# Patient Record
Sex: Male | Born: 1950 | Race: White | Hispanic: No | Marital: Married | Smoking: Current every day smoker
Health system: Southern US, Community
[De-identification: ages and names within clinical notes are randomized; demographics above are authoritative.]

## PROBLEM LIST (undated history)

## (undated) DIAGNOSIS — F319 Bipolar disorder, unspecified: Secondary | ICD-10-CM

## (undated) DIAGNOSIS — G629 Polyneuropathy, unspecified: Secondary | ICD-10-CM

---

## 2010-12-30 ENCOUNTER — Emergency Department (HOSPITAL_BASED_OUTPATIENT_CLINIC_OR_DEPARTMENT_OTHER)
Admission: EM | Admit: 2010-12-30 | Discharge: 2010-12-30 | Disposition: A | Discharge status: Home / Self Care | Payer: Self-pay | Attending: Emergency Medicine | Admitting: Emergency Medicine

## 2010-12-30 ENCOUNTER — Emergency Department (INDEPENDENT_AMBULATORY_CARE_PROVIDER_SITE_OTHER): Payer: Self-pay

## 2010-12-30 ENCOUNTER — Other Ambulatory Visit: Payer: Self-pay

## 2010-12-30 DIAGNOSIS — J189 Pneumonia, unspecified organism: Secondary | ICD-10-CM | POA: Insufficient documentation

## 2010-12-30 DIAGNOSIS — W19XXXA Unspecified fall, initial encounter: Secondary | ICD-10-CM | POA: Insufficient documentation

## 2010-12-30 DIAGNOSIS — R209 Unspecified disturbances of skin sensation: Secondary | ICD-10-CM | POA: Insufficient documentation

## 2010-12-30 DIAGNOSIS — F319 Bipolar disorder, unspecified: Secondary | ICD-10-CM | POA: Insufficient documentation

## 2010-12-30 DIAGNOSIS — F17200 Nicotine dependence, unspecified, uncomplicated: Secondary | ICD-10-CM | POA: Insufficient documentation

## 2010-12-30 DIAGNOSIS — R05 Cough: Secondary | ICD-10-CM

## 2010-12-30 HISTORY — DX: Bipolar disorder, unspecified: F31.9

## 2010-12-30 HISTORY — DX: Polyneuropathy, unspecified: G62.9

## 2010-12-30 LAB — COMPREHENSIVE METABOLIC PANEL
ALT: 16 U/L (ref 0–53)
AST: 23 U/L (ref 0–37)
Calcium: 9.2 mg/dL (ref 8.4–10.5)
GFR calc Af Amer: >60 mL/min (ref >60)
Glucose, Bld: 108 mg/dL — ABNORMAL HIGH (ref 70–99)
Sodium: 136 mEq/L (ref 135–145)
Total Protein: 7.1 g/dL (ref 6.0–8.3)

## 2010-12-30 LAB — URINALYSIS, ROUTINE W REFLEX MICROSCOPIC
Bilirubin Urine: NEGATIVE
Nitrite: NEGATIVE
Specific Gravity, Urine: 1.016 (ref 1.005–1.030)
pH: 6 (ref 5.0–8.0)

## 2010-12-30 LAB — CBC
MCH: 30.7 pg (ref 26.0–34.0)
MCHC: 33 g/dL (ref 30.0–36.0)
Platelets: 267 10*3/uL (ref 150–400)
RDW: 13.1 % (ref 11.5–15.5)

## 2010-12-30 LAB — DIFFERENTIAL
Basophils Absolute: 0 10*3/uL (ref 0.0–0.1)
Eosinophils Absolute: 0 10*3/uL (ref 0.0–0.7)
Monocytes Absolute: 1.6 10*3/uL — ABNORMAL HIGH (ref 0.1–1.0)
Neutrophils Relative %: 82 % — ABNORMAL HIGH (ref 43–77)

## 2010-12-30 MED ORDER — DEXTROSE 5 % IV SOLN
500.0000 mg | Freq: Once | INTRAVENOUS | Status: DC
  Filled 2010-12-30: qty 500

## 2010-12-30 MED ORDER — AZITHROMYCIN 250 MG PO TABS: 250.0000 mg | ORAL_TABLET | Freq: Every day | ORAL | Status: AC

## 2010-12-30 MED ORDER — SODIUM CHLORIDE 0.9 % IV SOLN
Freq: Once | INTRAVENOUS | Status: AC
  Administered 2010-12-30 (×2): via INTRAVENOUS

## 2010-12-30 MED ORDER — SODIUM CHLORIDE 0.9 % IV SOLN
Freq: Once | INTRAVENOUS | Status: AC
  Administered 2010-12-30: 15:00:00 via INTRAVENOUS

## 2010-12-30 MED ORDER — DEXTROSE 5 % IV SOLN
1.0000 g | INTRAVENOUS | Status: DC
  Administered 2010-12-30: 1 g via INTRAVENOUS
  Filled 2010-12-30: qty 1

## 2010-12-30 NOTE — ED Provider Notes (Signed)
History     CSN: [id number] Arrival date & time: 12/30/2010 12:27 PM  Chief Complaint  Patient presents with  . Fall   Patient is a 60 y.o. male presenting with fall. The history is provided by the patient. No language interpreter was used.  Fall The accident occurred 6 to 12 hours ago. The fall occurred while recreating/playing. He fell from a height of 3 to 5 ft. He landed on carpet. The pain is at a severity of 8/10. The pain is moderate. He was ambulatory at the scene. There was no entrapment after the fall. There was no drug use involved in the accident. There was no alcohol use involved in the accident. Associated symptoms include numbness. Pertinent negatives include no nausea. The symptoms are aggravated by activity. He has tried nothing for the symptoms. The treatment provided moderate relief.  Pt complains  Of feeling weak today. Pt reports he fell down earlier.  Pt reports he has had o cough and has not been feeling well.  Past Medical History  Diagnosis Date  . Bipolar illness   . Neuropathy     History reviewed. No pertinent past surgical history.  No family history on file.  History  Substance Use Topics  . Smoking status: Current Everyday Smoker  . Smokeless tobacco: Not on file  . Alcohol Use:       Review of Systems  Gastrointestinal: Negative for nausea.  Neurological: Positive for numbness.  All other systems reviewed and are negative.    Physical Exam  BP 94/50  Pulse 87  Temp(Src) 99.3 F (37.4 C) (Oral)  Resp 24  Ht 5\' 10"  (1.778 m)  Wt 160 lb (72.576 kg)  BMI 22.96 kg/m2  SpO2 98%  Physical Exam  Nursing note and vitals reviewed. Constitutional: He is oriented to person, place, and time. He appears well-developed and well-nourished.  HENT:  Head: Normocephalic.  Eyes: Pupils are equal, round, and reactive to light.  Neck: Normal range of motion.  Cardiovascular: Normal rate.   Pulmonary/Chest: Effort normal.  Abdominal: Soft.    Musculoskeletal: Normal range of motion.  Neurological: He is alert and oriented to person, place, and time. He has normal reflexes.  Skin: Skin is warm and dry.  Psychiatric: He has a normal mood and affect.    ED Course  Procedures  MDM Chest xray shows pneumonia.  Pt reports he feels better after Iv fluids,  Pt given ROcephin and zithromax.   Pt advised to see his MD for recheck tommorow.  Pt given RX for zithromax.      Langston Masker, [place] 12/30/10 [phone]

## 2010-12-30 NOTE — ED Notes (Signed)
IVF infused and site saline locked.  Pt ambulatory to BR with assistance.

## 2010-12-30 NOTE — ED Notes (Signed)
Family at bedside.  Eating,tolerating well,no complaints at present

## 2010-12-30 NOTE — ED Notes (Signed)
Pt ambulatory in hallway by EMT.  Pt had no difficulty ambulating.

## 2010-12-30 NOTE — ED Notes (Signed)
Pt fell twice today-pt deinies LOC-states he became dizzy and fell-uses cane

## 2011-01-01 NOTE — ED Provider Notes (Signed)
Medical screening examination/treatment/procedure(s) were performed by non-physician practitioner and as supervising physician I was immediately available for consultation/collaboration.   Alessia Gonsalez B. Bernette Mayers, MD 01/01/11 1017

## 2015-01-04 DIAGNOSIS — K219 Gastro-esophageal reflux disease without esophagitis: Secondary | ICD-10-CM | POA: Diagnosis not present

## 2015-01-04 DIAGNOSIS — M545 Low back pain: Secondary | ICD-10-CM | POA: Diagnosis not present

## 2015-01-04 DIAGNOSIS — R252 Cramp and spasm: Secondary | ICD-10-CM | POA: Diagnosis not present

## 2015-01-04 DIAGNOSIS — F900 Attention-deficit hyperactivity disorder, predominantly inattentive type: Secondary | ICD-10-CM | POA: Diagnosis not present

## 2015-01-04 DIAGNOSIS — Z79899 Other long term (current) drug therapy: Secondary | ICD-10-CM | POA: Diagnosis not present

## 2015-01-31 DIAGNOSIS — F988 Other specified behavioral and emotional disorders with onset usually occurring in childhood and adolescence: Secondary | ICD-10-CM | POA: Diagnosis not present

## 2015-01-31 DIAGNOSIS — Z6823 Body mass index (BMI) 23.0-23.9, adult: Secondary | ICD-10-CM | POA: Diagnosis not present

## 2015-01-31 DIAGNOSIS — E782 Mixed hyperlipidemia: Secondary | ICD-10-CM | POA: Diagnosis not present

## 2015-01-31 DIAGNOSIS — F5104 Psychophysiologic insomnia: Secondary | ICD-10-CM | POA: Diagnosis not present

## 2015-01-31 DIAGNOSIS — M545 Low back pain: Secondary | ICD-10-CM | POA: Diagnosis not present

## 2015-03-01 DIAGNOSIS — M545 Low back pain: Secondary | ICD-10-CM | POA: Diagnosis not present

## 2015-03-01 DIAGNOSIS — F988 Other specified behavioral and emotional disorders with onset usually occurring in childhood and adolescence: Secondary | ICD-10-CM | POA: Diagnosis not present

## 2015-03-01 DIAGNOSIS — R0981 Nasal congestion: Secondary | ICD-10-CM | POA: Diagnosis not present

## 2015-03-01 DIAGNOSIS — G2581 Restless legs syndrome: Secondary | ICD-10-CM | POA: Diagnosis not present

## 2015-03-01 DIAGNOSIS — Z79899 Other long term (current) drug therapy: Secondary | ICD-10-CM | POA: Diagnosis not present

## 2015-03-29 DIAGNOSIS — G2581 Restless legs syndrome: Secondary | ICD-10-CM | POA: Diagnosis not present

## 2015-03-29 DIAGNOSIS — M545 Low back pain: Secondary | ICD-10-CM | POA: Diagnosis not present

## 2015-03-29 DIAGNOSIS — Z79899 Other long term (current) drug therapy: Secondary | ICD-10-CM | POA: Diagnosis not present

## 2015-03-29 DIAGNOSIS — Z6823 Body mass index (BMI) 23.0-23.9, adult: Secondary | ICD-10-CM | POA: Diagnosis not present

## 2015-03-29 DIAGNOSIS — F988 Other specified behavioral and emotional disorders with onset usually occurring in childhood and adolescence: Secondary | ICD-10-CM | POA: Diagnosis not present

## 2015-04-25 DIAGNOSIS — M545 Low back pain: Secondary | ICD-10-CM | POA: Diagnosis not present

## 2015-04-25 DIAGNOSIS — Z6823 Body mass index (BMI) 23.0-23.9, adult: Secondary | ICD-10-CM | POA: Diagnosis not present

## 2015-04-25 DIAGNOSIS — E782 Mixed hyperlipidemia: Secondary | ICD-10-CM | POA: Diagnosis not present

## 2015-05-04 DIAGNOSIS — L821 Other seborrheic keratosis: Secondary | ICD-10-CM | POA: Diagnosis not present

## 2015-05-04 DIAGNOSIS — D492 Neoplasm of unspecified behavior of bone, soft tissue, and skin: Secondary | ICD-10-CM | POA: Diagnosis not present

## 2015-05-04 DIAGNOSIS — C44212 Basal cell carcinoma of skin of right ear and external auricular canal: Secondary | ICD-10-CM | POA: Diagnosis not present

## 2015-05-04 DIAGNOSIS — L570 Actinic keratosis: Secondary | ICD-10-CM | POA: Diagnosis not present

## 2015-05-04 DIAGNOSIS — L814 Other melanin hyperpigmentation: Secondary | ICD-10-CM | POA: Diagnosis not present

## 2015-05-24 DIAGNOSIS — Z79899 Other long term (current) drug therapy: Secondary | ICD-10-CM | POA: Diagnosis not present

## 2015-05-24 DIAGNOSIS — Z1389 Encounter for screening for other disorder: Secondary | ICD-10-CM | POA: Diagnosis not present

## 2015-05-24 DIAGNOSIS — M545 Low back pain: Secondary | ICD-10-CM | POA: Diagnosis not present

## 2015-05-24 DIAGNOSIS — E559 Vitamin D deficiency, unspecified: Secondary | ICD-10-CM | POA: Diagnosis not present

## 2015-05-24 DIAGNOSIS — J069 Acute upper respiratory infection, unspecified: Secondary | ICD-10-CM | POA: Diagnosis not present

## 2015-05-24 DIAGNOSIS — E782 Mixed hyperlipidemia: Secondary | ICD-10-CM | POA: Diagnosis not present

## 2015-06-21 DIAGNOSIS — M545 Low back pain: Secondary | ICD-10-CM | POA: Diagnosis not present

## 2015-06-21 DIAGNOSIS — E559 Vitamin D deficiency, unspecified: Secondary | ICD-10-CM | POA: Diagnosis not present

## 2015-06-21 DIAGNOSIS — R21 Rash and other nonspecific skin eruption: Secondary | ICD-10-CM | POA: Diagnosis not present

## 2015-06-21 DIAGNOSIS — E782 Mixed hyperlipidemia: Secondary | ICD-10-CM | POA: Diagnosis not present

## 2015-06-21 DIAGNOSIS — F988 Other specified behavioral and emotional disorders with onset usually occurring in childhood and adolescence: Secondary | ICD-10-CM | POA: Diagnosis not present

## 2015-07-12 DIAGNOSIS — Z6823 Body mass index (BMI) 23.0-23.9, adult: Secondary | ICD-10-CM | POA: Diagnosis not present

## 2015-07-12 DIAGNOSIS — M545 Low back pain: Secondary | ICD-10-CM | POA: Diagnosis not present

## 2015-07-12 DIAGNOSIS — G629 Polyneuropathy, unspecified: Secondary | ICD-10-CM | POA: Diagnosis not present

## 2015-07-23 DIAGNOSIS — C4441 Basal cell carcinoma of skin of scalp and neck: Secondary | ICD-10-CM | POA: Diagnosis not present

## 2015-08-08 DIAGNOSIS — Z6824 Body mass index (BMI) 24.0-24.9, adult: Secondary | ICD-10-CM | POA: Diagnosis not present

## 2015-08-08 DIAGNOSIS — Z79899 Other long term (current) drug therapy: Secondary | ICD-10-CM | POA: Diagnosis not present

## 2015-08-08 DIAGNOSIS — C4490 Unspecified malignant neoplasm of skin, unspecified: Secondary | ICD-10-CM | POA: Diagnosis not present

## 2015-08-08 DIAGNOSIS — M545 Low back pain: Secondary | ICD-10-CM | POA: Diagnosis not present

## 2015-08-08 DIAGNOSIS — F988 Other specified behavioral and emotional disorders with onset usually occurring in childhood and adolescence: Secondary | ICD-10-CM | POA: Diagnosis not present

## 2015-08-16 DIAGNOSIS — Z08 Encounter for follow-up examination after completed treatment for malignant neoplasm: Secondary | ICD-10-CM | POA: Diagnosis not present

## 2015-08-16 DIAGNOSIS — L570 Actinic keratosis: Secondary | ICD-10-CM | POA: Diagnosis not present

## 2015-08-16 DIAGNOSIS — L821 Other seborrheic keratosis: Secondary | ICD-10-CM | POA: Diagnosis not present

## 2015-08-16 DIAGNOSIS — Z85828 Personal history of other malignant neoplasm of skin: Secondary | ICD-10-CM | POA: Diagnosis not present

## 2015-09-12 DIAGNOSIS — M1990 Unspecified osteoarthritis, unspecified site: Secondary | ICD-10-CM | POA: Diagnosis not present

## 2015-09-12 DIAGNOSIS — F988 Other specified behavioral and emotional disorders with onset usually occurring in childhood and adolescence: Secondary | ICD-10-CM | POA: Diagnosis not present

## 2015-09-12 DIAGNOSIS — G2581 Restless legs syndrome: Secondary | ICD-10-CM | POA: Diagnosis not present

## 2015-09-12 DIAGNOSIS — E782 Mixed hyperlipidemia: Secondary | ICD-10-CM | POA: Diagnosis not present

## 2015-09-12 DIAGNOSIS — M545 Low back pain: Secondary | ICD-10-CM | POA: Diagnosis not present

## 2015-10-03 ENCOUNTER — Encounter: Payer: Self-pay | Admitting: Family Medicine

## 2015-10-03 NOTE — Telephone Encounter (Signed)
Incorrect patient.  Encounter closed.  Charyl Bigger, CMA

## 2015-10-03 NOTE — Telephone Encounter (Signed)
Patient wife called and states that her Jonathan Macdonald was here on Monday and seen Dr.Opalski and he still needs a 90day supply called into [facility] ..... Hydrochlorothizide 12.5mg , Lisinopril 10mg , and Atorvastatin 20mg 

## 2015-10-11 DIAGNOSIS — Z79899 Other long term (current) drug therapy: Secondary | ICD-10-CM | POA: Diagnosis not present

## 2015-10-11 DIAGNOSIS — Z6823 Body mass index (BMI) 23.0-23.9, adult: Secondary | ICD-10-CM | POA: Diagnosis not present

## 2015-10-11 DIAGNOSIS — M545 Low back pain: Secondary | ICD-10-CM | POA: Diagnosis not present

## 2015-11-08 DIAGNOSIS — Z139 Encounter for screening, unspecified: Secondary | ICD-10-CM | POA: Diagnosis not present

## 2015-11-08 DIAGNOSIS — Z79899 Other long term (current) drug therapy: Secondary | ICD-10-CM | POA: Diagnosis not present

## 2015-11-08 DIAGNOSIS — Z6823 Body mass index (BMI) 23.0-23.9, adult: Secondary | ICD-10-CM | POA: Diagnosis not present

## 2015-11-08 DIAGNOSIS — M545 Low back pain: Secondary | ICD-10-CM | POA: Diagnosis not present

## 2015-12-05 DIAGNOSIS — F988 Other specified behavioral and emotional disorders with onset usually occurring in childhood and adolescence: Secondary | ICD-10-CM | POA: Diagnosis not present

## 2015-12-05 DIAGNOSIS — M545 Low back pain: Secondary | ICD-10-CM | POA: Diagnosis not present

## 2015-12-05 DIAGNOSIS — R238 Other skin changes: Secondary | ICD-10-CM | POA: Diagnosis not present

## 2015-12-05 DIAGNOSIS — M1990 Unspecified osteoarthritis, unspecified site: Secondary | ICD-10-CM | POA: Diagnosis not present

## 2015-12-05 DIAGNOSIS — F5104 Psychophysiologic insomnia: Secondary | ICD-10-CM | POA: Diagnosis not present

## 2015-12-31 DIAGNOSIS — M545 Low back pain: Secondary | ICD-10-CM | POA: Diagnosis not present

## 2015-12-31 DIAGNOSIS — Z79899 Other long term (current) drug therapy: Secondary | ICD-10-CM | POA: Diagnosis not present

## 2015-12-31 DIAGNOSIS — K219 Gastro-esophageal reflux disease without esophagitis: Secondary | ICD-10-CM | POA: Diagnosis not present

## 2016-01-30 DIAGNOSIS — Z6823 Body mass index (BMI) 23.0-23.9, adult: Secondary | ICD-10-CM | POA: Diagnosis not present

## 2016-01-30 DIAGNOSIS — M545 Low back pain: Secondary | ICD-10-CM | POA: Diagnosis not present

## 2016-01-30 DIAGNOSIS — Z9181 History of falling: Secondary | ICD-10-CM | POA: Diagnosis not present

## 2016-02-28 DIAGNOSIS — G4700 Insomnia, unspecified: Secondary | ICD-10-CM | POA: Diagnosis not present

## 2016-02-28 DIAGNOSIS — Z6824 Body mass index (BMI) 24.0-24.9, adult: Secondary | ICD-10-CM | POA: Diagnosis not present

## 2016-02-28 DIAGNOSIS — Z79899 Other long term (current) drug therapy: Secondary | ICD-10-CM | POA: Diagnosis not present

## 2016-02-28 DIAGNOSIS — M545 Low back pain: Secondary | ICD-10-CM | POA: Diagnosis not present

## 2016-03-26 DIAGNOSIS — M545 Low back pain: Secondary | ICD-10-CM | POA: Diagnosis not present

## 2016-03-26 DIAGNOSIS — H269 Unspecified cataract: Secondary | ICD-10-CM | POA: Diagnosis not present

## 2016-03-26 DIAGNOSIS — Z6823 Body mass index (BMI) 23.0-23.9, adult: Secondary | ICD-10-CM | POA: Diagnosis not present

## 2016-03-26 DIAGNOSIS — G2581 Restless legs syndrome: Secondary | ICD-10-CM | POA: Diagnosis not present

## 2016-04-24 DIAGNOSIS — R5383 Other fatigue: Secondary | ICD-10-CM | POA: Diagnosis not present

## 2016-04-24 DIAGNOSIS — M545 Low back pain: Secondary | ICD-10-CM | POA: Diagnosis not present

## 2016-04-24 DIAGNOSIS — Z79899 Other long term (current) drug therapy: Secondary | ICD-10-CM | POA: Diagnosis not present

## 2016-05-13 DIAGNOSIS — K047 Periapical abscess without sinus: Secondary | ICD-10-CM | POA: Diagnosis not present

## 2016-05-22 DIAGNOSIS — R5383 Other fatigue: Secondary | ICD-10-CM | POA: Diagnosis not present

## 2016-05-22 DIAGNOSIS — Z79899 Other long term (current) drug therapy: Secondary | ICD-10-CM | POA: Diagnosis not present

## 2016-05-22 DIAGNOSIS — M545 Low back pain: Secondary | ICD-10-CM | POA: Diagnosis not present

## 2016-05-22 DIAGNOSIS — E782 Mixed hyperlipidemia: Secondary | ICD-10-CM | POA: Diagnosis not present

## 2016-05-22 DIAGNOSIS — E559 Vitamin D deficiency, unspecified: Secondary | ICD-10-CM | POA: Diagnosis not present

## 2016-06-19 DIAGNOSIS — Z1389 Encounter for screening for other disorder: Secondary | ICD-10-CM | POA: Diagnosis not present

## 2016-06-19 DIAGNOSIS — E319 Polyglandular dysfunction, unspecified: Secondary | ICD-10-CM | POA: Diagnosis not present

## 2016-06-19 DIAGNOSIS — M545 Low back pain: Secondary | ICD-10-CM | POA: Diagnosis not present

## 2016-07-17 DIAGNOSIS — Z79899 Other long term (current) drug therapy: Secondary | ICD-10-CM | POA: Diagnosis not present

## 2016-07-17 DIAGNOSIS — M545 Low back pain: Secondary | ICD-10-CM | POA: Diagnosis not present

## 2016-07-17 DIAGNOSIS — Z6823 Body mass index (BMI) 23.0-23.9, adult: Secondary | ICD-10-CM | POA: Diagnosis not present

## 2016-07-17 DIAGNOSIS — R0981 Nasal congestion: Secondary | ICD-10-CM | POA: Diagnosis not present

## 2016-08-05 DIAGNOSIS — H25812 Combined forms of age-related cataract, left eye: Secondary | ICD-10-CM | POA: Diagnosis not present

## 2016-08-05 DIAGNOSIS — H524 Presbyopia: Secondary | ICD-10-CM | POA: Diagnosis not present

## 2016-08-11 DIAGNOSIS — M545 Low back pain: Secondary | ICD-10-CM | POA: Diagnosis not present

## 2016-08-11 DIAGNOSIS — Z6823 Body mass index (BMI) 23.0-23.9, adult: Secondary | ICD-10-CM | POA: Diagnosis not present

## 2016-08-11 DIAGNOSIS — H269 Unspecified cataract: Secondary | ICD-10-CM | POA: Diagnosis not present

## 2016-08-11 DIAGNOSIS — Z79899 Other long term (current) drug therapy: Secondary | ICD-10-CM | POA: Diagnosis not present

## 2016-08-27 DIAGNOSIS — H2512 Age-related nuclear cataract, left eye: Secondary | ICD-10-CM | POA: Diagnosis not present

## 2016-09-12 DIAGNOSIS — Z79899 Other long term (current) drug therapy: Secondary | ICD-10-CM | POA: Diagnosis not present

## 2016-09-12 DIAGNOSIS — E782 Mixed hyperlipidemia: Secondary | ICD-10-CM | POA: Diagnosis not present

## 2016-09-12 DIAGNOSIS — E559 Vitamin D deficiency, unspecified: Secondary | ICD-10-CM | POA: Diagnosis not present

## 2016-09-12 DIAGNOSIS — M545 Low back pain: Secondary | ICD-10-CM | POA: Diagnosis not present

## 2016-09-24 DIAGNOSIS — H2512 Age-related nuclear cataract, left eye: Secondary | ICD-10-CM | POA: Diagnosis not present

## 2016-09-24 DIAGNOSIS — H25812 Combined forms of age-related cataract, left eye: Secondary | ICD-10-CM | POA: Diagnosis not present

## 2016-10-09 DIAGNOSIS — Z6823 Body mass index (BMI) 23.0-23.9, adult: Secondary | ICD-10-CM | POA: Diagnosis not present

## 2016-10-09 DIAGNOSIS — M545 Low back pain: Secondary | ICD-10-CM | POA: Diagnosis not present

## 2016-11-10 DIAGNOSIS — G629 Polyneuropathy, unspecified: Secondary | ICD-10-CM | POA: Diagnosis not present

## 2016-11-10 DIAGNOSIS — Z79899 Other long term (current) drug therapy: Secondary | ICD-10-CM | POA: Diagnosis not present

## 2016-11-10 DIAGNOSIS — M545 Low back pain: Secondary | ICD-10-CM | POA: Diagnosis not present

## 2016-12-09 DIAGNOSIS — M545 Low back pain: Secondary | ICD-10-CM | POA: Diagnosis not present

## 2016-12-09 DIAGNOSIS — G629 Polyneuropathy, unspecified: Secondary | ICD-10-CM | POA: Diagnosis not present

## 2016-12-09 DIAGNOSIS — Z139 Encounter for screening, unspecified: Secondary | ICD-10-CM | POA: Diagnosis not present

## 2017-01-06 DIAGNOSIS — Z6823 Body mass index (BMI) 23.0-23.9, adult: Secondary | ICD-10-CM | POA: Diagnosis not present

## 2017-01-06 DIAGNOSIS — M545 Low back pain: Secondary | ICD-10-CM | POA: Diagnosis not present

## 2017-01-14 DIAGNOSIS — Z961 Presence of intraocular lens: Secondary | ICD-10-CM | POA: Diagnosis not present

## 2017-02-02 DIAGNOSIS — Z9181 History of falling: Secondary | ICD-10-CM | POA: Diagnosis not present

## 2017-02-02 DIAGNOSIS — M545 Low back pain: Secondary | ICD-10-CM | POA: Diagnosis not present

## 2017-02-02 DIAGNOSIS — Z79899 Other long term (current) drug therapy: Secondary | ICD-10-CM | POA: Diagnosis not present

## 2017-02-03 DIAGNOSIS — Z79899 Other long term (current) drug therapy: Secondary | ICD-10-CM | POA: Diagnosis not present

## 2017-03-02 DIAGNOSIS — M545 Low back pain: Secondary | ICD-10-CM | POA: Diagnosis not present

## 2017-03-02 DIAGNOSIS — G629 Polyneuropathy, unspecified: Secondary | ICD-10-CM | POA: Diagnosis not present

## 2017-03-02 DIAGNOSIS — Z6823 Body mass index (BMI) 23.0-23.9, adult: Secondary | ICD-10-CM | POA: Diagnosis not present

## 2017-04-01 DIAGNOSIS — Z79899 Other long term (current) drug therapy: Secondary | ICD-10-CM | POA: Diagnosis not present

## 2017-04-01 DIAGNOSIS — G629 Polyneuropathy, unspecified: Secondary | ICD-10-CM | POA: Diagnosis not present

## 2017-04-01 DIAGNOSIS — C4440 Unspecified malignant neoplasm of skin of scalp and neck: Secondary | ICD-10-CM | POA: Diagnosis not present

## 2017-04-01 DIAGNOSIS — M545 Low back pain: Secondary | ICD-10-CM | POA: Diagnosis not present

## 2017-04-06 DIAGNOSIS — Z79899 Other long term (current) drug therapy: Secondary | ICD-10-CM | POA: Diagnosis not present

## 2017-04-28 DIAGNOSIS — M545 Low back pain: Secondary | ICD-10-CM | POA: Diagnosis not present

## 2017-04-28 DIAGNOSIS — E782 Mixed hyperlipidemia: Secondary | ICD-10-CM | POA: Diagnosis not present

## 2017-04-28 DIAGNOSIS — G629 Polyneuropathy, unspecified: Secondary | ICD-10-CM | POA: Diagnosis not present

## 2017-05-18 DIAGNOSIS — H11153 Pinguecula, bilateral: Secondary | ICD-10-CM | POA: Diagnosis not present

## 2017-05-18 DIAGNOSIS — H53001 Unspecified amblyopia, right eye: Secondary | ICD-10-CM | POA: Diagnosis not present

## 2017-05-27 DIAGNOSIS — M545 Low back pain: Secondary | ICD-10-CM | POA: Diagnosis not present

## 2017-05-27 DIAGNOSIS — Z79899 Other long term (current) drug therapy: Secondary | ICD-10-CM | POA: Diagnosis not present

## 2017-05-27 DIAGNOSIS — E782 Mixed hyperlipidemia: Secondary | ICD-10-CM | POA: Diagnosis not present

## 2017-06-18 DIAGNOSIS — Z79899 Other long term (current) drug therapy: Secondary | ICD-10-CM | POA: Diagnosis not present

## 2017-06-18 DIAGNOSIS — E782 Mixed hyperlipidemia: Secondary | ICD-10-CM | POA: Diagnosis not present

## 2017-06-18 DIAGNOSIS — M545 Low back pain: Secondary | ICD-10-CM | POA: Diagnosis not present

## 2017-06-18 DIAGNOSIS — E559 Vitamin D deficiency, unspecified: Secondary | ICD-10-CM | POA: Diagnosis not present

## 2017-07-09 DIAGNOSIS — M545 Low back pain: Secondary | ICD-10-CM | POA: Diagnosis not present

## 2017-07-09 DIAGNOSIS — E782 Mixed hyperlipidemia: Secondary | ICD-10-CM | POA: Diagnosis not present

## 2017-07-09 DIAGNOSIS — Z1331 Encounter for screening for depression: Secondary | ICD-10-CM | POA: Diagnosis not present

## 2017-08-11 DIAGNOSIS — Z79899 Other long term (current) drug therapy: Secondary | ICD-10-CM | POA: Diagnosis not present

## 2017-08-11 DIAGNOSIS — R252 Cramp and spasm: Secondary | ICD-10-CM | POA: Diagnosis not present

## 2017-08-11 DIAGNOSIS — M545 Low back pain: Secondary | ICD-10-CM | POA: Diagnosis not present

## 2017-09-15 DIAGNOSIS — G629 Polyneuropathy, unspecified: Secondary | ICD-10-CM | POA: Diagnosis not present

## 2017-09-15 DIAGNOSIS — Z79899 Other long term (current) drug therapy: Secondary | ICD-10-CM | POA: Diagnosis not present

## 2017-09-15 DIAGNOSIS — M545 Low back pain: Secondary | ICD-10-CM | POA: Diagnosis not present

## 2017-10-13 DIAGNOSIS — M545 Low back pain: Secondary | ICD-10-CM | POA: Diagnosis not present

## 2017-10-13 DIAGNOSIS — Z79899 Other long term (current) drug therapy: Secondary | ICD-10-CM | POA: Diagnosis not present

## 2017-10-13 DIAGNOSIS — K219 Gastro-esophageal reflux disease without esophagitis: Secondary | ICD-10-CM | POA: Diagnosis not present

## 2017-10-13 DIAGNOSIS — Z6823 Body mass index (BMI) 23.0-23.9, adult: Secondary | ICD-10-CM | POA: Diagnosis not present

## 2017-10-19 DIAGNOSIS — Z79899 Other long term (current) drug therapy: Secondary | ICD-10-CM | POA: Diagnosis not present

## 2017-11-09 DIAGNOSIS — G2581 Restless legs syndrome: Secondary | ICD-10-CM | POA: Diagnosis not present

## 2017-11-09 DIAGNOSIS — R252 Cramp and spasm: Secondary | ICD-10-CM | POA: Diagnosis not present

## 2017-11-09 DIAGNOSIS — K047 Periapical abscess without sinus: Secondary | ICD-10-CM | POA: Diagnosis not present

## 2017-11-09 DIAGNOSIS — M545 Low back pain: Secondary | ICD-10-CM | POA: Diagnosis not present

## 2017-12-08 DIAGNOSIS — M545 Low back pain: Secondary | ICD-10-CM | POA: Diagnosis not present

## 2017-12-08 DIAGNOSIS — Z79899 Other long term (current) drug therapy: Secondary | ICD-10-CM | POA: Diagnosis not present

## 2018-01-05 DIAGNOSIS — E559 Vitamin D deficiency, unspecified: Secondary | ICD-10-CM | POA: Diagnosis not present

## 2018-01-05 DIAGNOSIS — Z79899 Other long term (current) drug therapy: Secondary | ICD-10-CM | POA: Diagnosis not present

## 2018-01-05 DIAGNOSIS — E782 Mixed hyperlipidemia: Secondary | ICD-10-CM | POA: Diagnosis not present

## 2018-01-05 DIAGNOSIS — M545 Low back pain: Secondary | ICD-10-CM | POA: Diagnosis not present

## 2018-02-01 DIAGNOSIS — E559 Vitamin D deficiency, unspecified: Secondary | ICD-10-CM | POA: Diagnosis not present

## 2018-02-01 DIAGNOSIS — E782 Mixed hyperlipidemia: Secondary | ICD-10-CM | POA: Diagnosis not present

## 2018-02-01 DIAGNOSIS — M545 Low back pain: Secondary | ICD-10-CM | POA: Diagnosis not present

## 2018-03-03 DIAGNOSIS — Z79899 Other long term (current) drug therapy: Secondary | ICD-10-CM | POA: Diagnosis not present

## 2018-03-03 DIAGNOSIS — Z6824 Body mass index (BMI) 24.0-24.9, adult: Secondary | ICD-10-CM | POA: Diagnosis not present

## 2018-03-03 DIAGNOSIS — M545 Low back pain: Secondary | ICD-10-CM | POA: Diagnosis not present

## 2018-03-30 DIAGNOSIS — M545 Low back pain: Secondary | ICD-10-CM | POA: Diagnosis not present

## 2018-03-30 DIAGNOSIS — Z6824 Body mass index (BMI) 24.0-24.9, adult: Secondary | ICD-10-CM | POA: Diagnosis not present

## 2019-02-15 ENCOUNTER — Encounter (HOSPITAL_BASED_OUTPATIENT_CLINIC_OR_DEPARTMENT_OTHER): Payer: Self-pay | Admitting: *Deleted

## 2019-02-15 ENCOUNTER — Other Ambulatory Visit: Payer: Self-pay

## 2019-02-15 ENCOUNTER — Emergency Department (HOSPITAL_BASED_OUTPATIENT_CLINIC_OR_DEPARTMENT_OTHER): Payer: Medicare Other

## 2019-02-15 ENCOUNTER — Emergency Department (HOSPITAL_BASED_OUTPATIENT_CLINIC_OR_DEPARTMENT_OTHER)
Admission: EM | Admit: 2019-02-15 | Discharge: 2019-02-15 | Disposition: A | Discharge status: Home / Self Care | Payer: Medicare Other | Attending: Emergency Medicine | Admitting: Emergency Medicine

## 2019-02-15 DIAGNOSIS — Y999 Unspecified external cause status: Secondary | ICD-10-CM | POA: Diagnosis not present

## 2019-02-15 DIAGNOSIS — W19XXXA Unspecified fall, initial encounter: Secondary | ICD-10-CM

## 2019-02-15 DIAGNOSIS — S0990XA Unspecified injury of head, initial encounter: Secondary | ICD-10-CM | POA: Diagnosis not present

## 2019-02-15 DIAGNOSIS — Y9301 Activity, walking, marching and hiking: Secondary | ICD-10-CM | POA: Insufficient documentation

## 2019-02-15 DIAGNOSIS — R0602 Shortness of breath: Secondary | ICD-10-CM | POA: Insufficient documentation

## 2019-02-15 DIAGNOSIS — F17210 Nicotine dependence, cigarettes, uncomplicated: Secondary | ICD-10-CM | POA: Insufficient documentation

## 2019-02-15 DIAGNOSIS — Y92002 Bathroom of unspecified non-institutional (private) residence single-family (private) house as the place of occurrence of the external cause: Secondary | ICD-10-CM | POA: Diagnosis not present

## 2019-02-15 DIAGNOSIS — S2241XA Multiple fractures of ribs, right side, initial encounter for closed fracture: Secondary | ICD-10-CM

## 2019-02-15 DIAGNOSIS — R109 Unspecified abdominal pain: Secondary | ICD-10-CM | POA: Diagnosis not present

## 2019-02-15 DIAGNOSIS — W010XXA Fall on same level from slipping, tripping and stumbling without subsequent striking against object, initial encounter: Secondary | ICD-10-CM | POA: Insufficient documentation

## 2019-02-15 DIAGNOSIS — S299XXA Unspecified injury of thorax, initial encounter: Secondary | ICD-10-CM | POA: Diagnosis present

## 2019-02-15 LAB — CBC WITH DIFFERENTIAL/PLATELET
Abs Immature Granulocytes: 0.02 10*3/uL (ref 0.00–0.07)
Basophils Absolute: 0.1 10*3/uL (ref 0.0–0.1)
Basophils Relative: 1 %
Eosinophils Absolute: 0.2 10*3/uL (ref 0.0–0.5)
Eosinophils Relative: 3 %
HCT: 40.7 % (ref 39.0–52.0)
Hemoglobin: 13 g/dL (ref 13.0–17.0)
Immature Granulocytes: 0 %
Lymphocytes Relative: 29 %
Lymphs Abs: 2.5 10*3/uL (ref 0.7–4.0)
MCH: 31.3 pg (ref 26.0–34.0)
MCHC: 31.9 g/dL (ref 30.0–36.0)
MCV: 97.8 fL (ref 80.0–100.0)
Monocytes Absolute: 1.2 10*3/uL — ABNORMAL HIGH (ref 0.1–1.0)
Monocytes Relative: 14 %
Neutro Abs: 4.7 10*3/uL (ref 1.7–7.7)
Neutrophils Relative %: 53 %
Platelets: 277 10*3/uL (ref 150–400)
RBC: 4.16 MIL/uL — ABNORMAL LOW (ref 4.22–5.81)
RDW: 13.2 % (ref 11.5–15.5)
WBC: 8.7 10*3/uL (ref 4.0–10.5)
nRBC: 0 % (ref 0.0–0.2)

## 2019-02-15 LAB — COMPREHENSIVE METABOLIC PANEL
ALT: 20 U/L (ref 0–44)
AST: 24 U/L (ref 15–41)
Albumin: 3.8 g/dL (ref 3.5–5.0)
Alkaline Phosphatase: 76 U/L (ref 38–126)
Anion gap: 8 (ref 5–15)
BUN: 12 mg/dL (ref 8–23)
CO2: 28 mmol/L (ref 22–32)
Calcium: 8.7 mg/dL — ABNORMAL LOW (ref 8.9–10.3)
Chloride: 102 mmol/L (ref 98–111)
Creatinine, Ser: 0.71 mg/dL (ref 0.61–1.24)
GFR calc Af Amer: >60 mL/min (ref >60)
GFR calc non Af Amer: >60 mL/min (ref >60)
Glucose, Bld: 117 mg/dL — ABNORMAL HIGH (ref 70–99)
Potassium: 4.5 mmol/L (ref 3.5–5.1)
Sodium: 138 mmol/L (ref 135–145)
Total Bilirubin: 0.5 mg/dL (ref 0.3–1.2)
Total Protein: 6.7 g/dL (ref 6.5–8.1)

## 2019-02-15 MED ORDER — IOHEXOL 300 MG/ML  SOLN
100.0000 mL | Freq: Once | INTRAMUSCULAR | Status: AC | PRN
  Administered 2019-02-15: 100 mL via INTRAVENOUS

## 2019-02-15 MED ORDER — FENTANYL CITRATE (PF) 100 MCG/2ML IJ SOLN
50.0000 ug | Freq: Once | INTRAMUSCULAR | Status: AC
  Administered 2019-02-15: 50 ug via INTRAVENOUS
  Filled 2019-02-15: qty 2

## 2019-02-15 NOTE — ED Notes (Signed)
Pt ambulated around department with steady gait. Pt SpO2 remained at 94% and HR 89.

## 2019-02-15 NOTE — ED Triage Notes (Signed)
Pt c/o fall x 3 days ago landing on right ribs , c/o pain
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
## 2019-02-15 NOTE — Discharge Instructions (Signed)
You have fractures of ribs 7, 8 and 9 on the right side.  Continue using your home pain medication and use over-the-counter salon pas lidocaine patches in addition to this.  Please contact your primary care doctor who prescribes your pain medication if you feel that you need additional pain relief.  Use incentive spirometer frequently as instructed to ensure you are taking deep breaths.  If you have worsening pain, worsening shortness of breath, cough, fevers or any other new or concerning symptoms return to the ED immediately.

## 2019-02-15 NOTE — ED Notes (Signed)
Patient transported to CT 

## 2019-07-18 ENCOUNTER — Emergency Department (HOSPITAL_BASED_OUTPATIENT_CLINIC_OR_DEPARTMENT_OTHER)
Admission: EM | Admit: 2019-07-18 | Discharge: 2019-07-18 | Disposition: A | Discharge status: Home / Self Care | Payer: Medicare Other | Attending: Emergency Medicine | Admitting: Emergency Medicine

## 2019-07-18 ENCOUNTER — Emergency Department (HOSPITAL_BASED_OUTPATIENT_CLINIC_OR_DEPARTMENT_OTHER): Payer: Medicare Other

## 2019-07-18 ENCOUNTER — Encounter (HOSPITAL_BASED_OUTPATIENT_CLINIC_OR_DEPARTMENT_OTHER): Payer: Self-pay

## 2019-07-18 ENCOUNTER — Other Ambulatory Visit: Payer: Self-pay

## 2019-07-18 DIAGNOSIS — W19XXXA Unspecified fall, initial encounter: Secondary | ICD-10-CM

## 2019-07-18 DIAGNOSIS — Y999 Unspecified external cause status: Secondary | ICD-10-CM | POA: Insufficient documentation

## 2019-07-18 DIAGNOSIS — F17290 Nicotine dependence, other tobacco product, uncomplicated: Secondary | ICD-10-CM | POA: Diagnosis not present

## 2019-07-18 DIAGNOSIS — Y939 Activity, unspecified: Secondary | ICD-10-CM | POA: Diagnosis not present

## 2019-07-18 DIAGNOSIS — Z79899 Other long term (current) drug therapy: Secondary | ICD-10-CM | POA: Diagnosis not present

## 2019-07-18 DIAGNOSIS — W0110XA Fall on same level from slipping, tripping and stumbling with subsequent striking against unspecified object, initial encounter: Secondary | ICD-10-CM | POA: Diagnosis not present

## 2019-07-18 DIAGNOSIS — S0101XA Laceration without foreign body of scalp, initial encounter: Secondary | ICD-10-CM

## 2019-07-18 DIAGNOSIS — Y929 Unspecified place or not applicable: Secondary | ICD-10-CM | POA: Diagnosis not present

## 2019-07-18 MED ORDER — LIDOCAINE-EPINEPHRINE (PF) 2 %-1:200000 IJ SOLN
10.0000 mL | Freq: Once | INTRAMUSCULAR | Status: AC
  Administered 2019-07-18: 10 mL
  Filled 2019-07-18: qty 10

## 2019-07-18 NOTE — ED Triage Notes (Signed)
Pt arrives ambulatory to ED with reports of fall around 330 this morning, hitting his head. Pt denies blood thinners, denies LOC.

## 2019-07-18 NOTE — ED Provider Notes (Signed)
[name] Provider Note   CSN: [id number] Arrival date & time: 07/18/19  1031     History Chief Complaint  Patient presents with  . Fall    Jonathan Macdonald is a 69 y.o. male.  He is here for evaluation of injuries after a fall early this morning.  He says he has chronic balance issues and has multiple falls.  Not on blood thinners.  He said he lost his balance this morning around 3:30 AM and fall striking his head.  No loss of consciousness.  Denies any other injuries or complaints.  Minimal pain.  Last tetanus was 3 months ago.  The history is provided by the patient.  Fall This is a recurrent problem. The current episode started 6 to 12 hours ago. The problem occurs rarely. The problem has not changed since onset.Associated symptoms include headaches. Pertinent negatives include no chest pain, no abdominal pain and no shortness of breath. Nothing aggravates the symptoms. Nothing relieves the symptoms. He has tried nothing for the symptoms. The treatment provided no relief.       Past Medical History:  Diagnosis Date  . Bipolar illness ([place])   . Neuropathy     There are no problems to display for this patient.   History reviewed. No pertinent surgical history.     No family history on file.  Social History   Tobacco Use  . Smoking status: Current Every Day Smoker    Types: Cigars  . Smokeless tobacco: Never Used  Substance Use Topics  . Alcohol use: Never  . Drug use: No    Home Medications Prior to Admission medications   Medication Sig Start Date End Date Taking? Authorizing Provider  OxyCODONE HCl (OXYCONTIN PO) Take by mouth.      [provider]  TRAZODONE HCL PO Take by mouth.      [provider]  UNKNOWN TO PATIENT abx for abscess     [provider]    Allergies    Patient has no known allergies.  Review of Systems   Review of Systems  Constitutional: Negative for fever.  HENT: Negative  for sore throat.   Eyes: Negative for visual disturbance.  Respiratory: Negative for shortness of breath.   Cardiovascular: Negative for chest pain.  Gastrointestinal: Negative for abdominal pain.  Genitourinary: Negative for dysuria.  Musculoskeletal: Negative for neck pain.  Skin: Negative for rash.  Neurological: Positive for numbness (neuropathy) and headaches.    Physical Exam Updated Vital Signs BP 136/85 (BP Location: Right Arm)   Pulse 93   Temp 98.7 F (37.1 C) (Oral)   Resp 18   Ht 5\' 6"  (1.676 m)   Wt 72.6 kg   SpO2 96%   BMI 25.82 kg/m   Physical Exam Vitals and nursing note reviewed.  Constitutional:      Appearance: He is well-developed.  HENT:     Head: Normocephalic.     Comments: Approximately 4 cm curvilinear laceration left parietal area Eyes:     Conjunctiva/sclera: Conjunctivae normal.  Cardiovascular:     Rate and Rhythm: Normal rate and regular rhythm.     Heart sounds: No murmur.  Pulmonary:     Effort: Pulmonary effort is normal. No respiratory distress.     Breath sounds: Normal breath sounds.  Abdominal:     Palpations: Abdomen is soft.     Tenderness: There is no abdominal tenderness.  Musculoskeletal:        General: No  deformity or signs of injury. Normal range of motion.     Cervical back: Neck supple.  Skin:    General: Skin is warm and dry.  Neurological:     Mental Status: He is alert. Mental status is at baseline.     Sensory: Sensory deficit:      ED Results / Procedures / Treatments   Labs (all labs ordered are listed, but only abnormal results are displayed) Labs Reviewed - No data to display  EKG None  Radiology CT Head Wo Contrast  Result Date: 07/18/2019 CLINICAL DATA:  Neck pain, acute, no red flags. Headache, posttraumatic. Additional history provided: Patient reports falling this morning at 3:30, hit left side of head on dresser, large cut to left parietal region, denies loss of consciousness, EXAM: CT HEAD  WITHOUT CONTRAST CT CERVICAL SPINE WITHOUT CONTRAST TECHNIQUE: Multidetector CT imaging of the head and cervical spine was performed following the standard protocol without intravenous contrast. Multiplanar CT image reconstructions of the cervical spine were also generated. COMPARISON:  CT head/cervical spine 02/15/2019 FINDINGS: CT HEAD FINDINGS Brain: There is no evidence of acute intracranial hemorrhage, intracranial mass, midline shift or extra-axial fluid collection.No demarcated cortical infarction. Redemonstrated advanced patchy and confluent hypodensity within the cerebral white matter which is nonspecific. Mild generalized parenchymal atrophy, stable. Vascular: No hyperdense vessel. Atherosclerotic calcifications. Skull: Normal. Negative for fracture or focal lesion. Sinuses/Orbits: Visualized orbits demonstrate no acute abnormality. Mild mucosal thickening within the inferior right maxillary sinus, partially imaged. No significant mastoid effusion. Other: Laceration within the left parietal scalp (series 3, image 69). CT CERVICAL SPINE FINDINGS Alignment: Slightly exaggerated cervical lordosis. No significant spondylolisthesis. Skull base and vertebrae: The basion-dental and atlanto-dental intervals are maintained. Soft tissues and spinal canal: No prevertebral fluid or swelling. No visible canal hematoma. Disc levels: Cervical spondylosis. Cervical spondylosis with multilevel disc bulges, endplate and uncovertebral spurring as well as facet hypertrophy. Disc degeneration is moderate/advanced at C6-C7. Upper chest: No consolidation within the imaged lung apices. No visible pneumothorax. IMPRESSION: CT head: 1. No evidence of acute intracranial abnormality. Left parietal scalp laceration. 2. Redemonstrated advanced cerebral white matter disease which is nonspecific, but most commonly seen on the basis of chronic small vessel ischemia. 3. Stable, mild generalized parenchymal atrophy. 4. Mild right maxillary  sinus mucosal thickening. CT cervical spine: 1. No evidence of acute fracture to the cervical spine. 2. Cervical spondylosis. Electronically Signed   By: Kellie Simmering DO   On: 07/18/2019 11:55   CT Cervical Spine Wo Contrast  Result Date: 07/18/2019 CLINICAL DATA:  Neck pain, acute, no red flags. Headache, posttraumatic. Additional history provided: Patient reports falling this morning at 3:30, hit left side of head on dresser, large cut to left parietal region, denies loss of consciousness, EXAM: CT HEAD WITHOUT CONTRAST CT CERVICAL SPINE WITHOUT CONTRAST TECHNIQUE: Multidetector CT imaging of the head and cervical spine was performed following the standard protocol without intravenous contrast. Multiplanar CT image reconstructions of the cervical spine were also generated. COMPARISON:  CT head/cervical spine 02/15/2019 FINDINGS: CT HEAD FINDINGS Brain: There is no evidence of acute intracranial hemorrhage, intracranial mass, midline shift or extra-axial fluid collection.No demarcated cortical infarction. Redemonstrated advanced patchy and confluent hypodensity within the cerebral white matter which is nonspecific. Mild generalized parenchymal atrophy, stable. Vascular: No hyperdense vessel. Atherosclerotic calcifications. Skull: Normal. Negative for fracture or focal lesion. Sinuses/Orbits: Visualized orbits demonstrate no acute abnormality. Mild mucosal thickening within the inferior right maxillary sinus, partially imaged. No significant mastoid effusion. Other:  Laceration within the left parietal scalp (series 3, image 69). CT CERVICAL SPINE FINDINGS Alignment: Slightly exaggerated cervical lordosis. No significant spondylolisthesis. Skull base and vertebrae: The basion-dental and atlanto-dental intervals are maintained. Soft tissues and spinal canal: No prevertebral fluid or swelling. No visible canal hematoma. Disc levels: Cervical spondylosis. Cervical spondylosis with multilevel disc bulges, endplate  and uncovertebral spurring as well as facet hypertrophy. Disc degeneration is moderate/advanced at C6-C7. Upper chest: No consolidation within the imaged lung apices. No visible pneumothorax. IMPRESSION: CT head: 1. No evidence of acute intracranial abnormality. Left parietal scalp laceration. 2. Redemonstrated advanced cerebral white matter disease which is nonspecific, but most commonly seen on the basis of chronic small vessel ischemia. 3. Stable, mild generalized parenchymal atrophy. 4. Mild right maxillary sinus mucosal thickening. CT cervical spine: 1. No evidence of acute fracture to the cervical spine. 2. Cervical spondylosis. Electronically Signed   By: Kellie Simmering DO   On: 07/18/2019 11:55    Procedures .Marland KitchenLaceration Repair  Date/Time: 07/18/2019 1:51 PM Performed by: Hayden Rasmussen, MD Authorized by: Hayden Rasmussen, MD   Consent:    Consent obtained:  Verbal   Consent given by:  Patient   Risks discussed:  Infection, pain, poor cosmetic result, poor wound healing and retained foreign body   Alternatives discussed:  No treatment and delayed treatment Anesthesia (see MAR for exact dosages):    Anesthesia method:  Local infiltration   Local anesthetic:  Lidocaine 2% WITH epi Laceration details:    Location:  Scalp   Scalp location:  L parietal   Length (cm):  4 Repair type:    Repair type:  Simple Treatment:    Area cleansed with:  Saline and Shur-Clens   Amount of cleaning:  Standard Skin repair:    Repair method:  Staples   Number of staples:  5 Approximation:    Approximation:  Close Post-procedure details:    Dressing:  Open (no dressing)   Patient tolerance of procedure:  Tolerated well, no immediate complications   (including critical care time)  Medications Ordered in ED Medications  lidocaine-EPINEPHrine (XYLOCAINE W/EPI) 2 %-1:200000 (PF) injection 10 mL (has no administration in time range)    ED Course  I have reviewed the triage vital signs and the  nursing notes.  Pertinent labs & imaging results that were available during my care of the patient were reviewed by me and considered in my medical decision making (see chart for details).    MDM Rules/Calculators/A&P                     DDX - bleed, fracture, contusion.  Final Clinical Impression(s) / ED Diagnoses Final diagnoses:  Fall, initial encounter  Laceration of scalp, initial encounter    Rx / DC Orders ED Discharge Orders    None       Hayden Rasmussen, MD 07/18/19 1756

## 2019-07-18 NOTE — Discharge Instructions (Signed)
You were seen in the emergency department for evaluation of injuries from a fall.  You had a CAT scan of your head and cervical spine that did not show any acute findings.  Your scalp laceration was stapled and these will need to be removed in about a week.  You may shower.  Follow-up with your doctor and return to the emergency department for any worsening or concerning symptoms

## 2020-01-26 ENCOUNTER — Emergency Department (HOSPITAL_BASED_OUTPATIENT_CLINIC_OR_DEPARTMENT_OTHER): Payer: Medicare Other

## 2020-01-26 ENCOUNTER — Other Ambulatory Visit: Payer: Self-pay

## 2020-01-26 ENCOUNTER — Encounter (HOSPITAL_BASED_OUTPATIENT_CLINIC_OR_DEPARTMENT_OTHER): Payer: Self-pay | Admitting: *Deleted

## 2020-01-26 ENCOUNTER — Emergency Department (HOSPITAL_BASED_OUTPATIENT_CLINIC_OR_DEPARTMENT_OTHER)
Admission: EM | Admit: 2020-01-26 | Discharge: 2020-01-26 | Disposition: A | Discharge status: Home / Self Care | Payer: Medicare Other | Attending: Emergency Medicine | Admitting: Emergency Medicine

## 2020-01-26 DIAGNOSIS — F17290 Nicotine dependence, other tobacco product, uncomplicated: Secondary | ICD-10-CM | POA: Insufficient documentation

## 2020-01-26 DIAGNOSIS — Y9301 Activity, walking, marching and hiking: Secondary | ICD-10-CM | POA: Diagnosis not present

## 2020-01-26 DIAGNOSIS — Y9289 Other specified places as the place of occurrence of the external cause: Secondary | ICD-10-CM | POA: Diagnosis not present

## 2020-01-26 DIAGNOSIS — M542 Cervicalgia: Secondary | ICD-10-CM | POA: Insufficient documentation

## 2020-01-26 DIAGNOSIS — M25522 Pain in left elbow: Secondary | ICD-10-CM | POA: Insufficient documentation

## 2020-01-26 DIAGNOSIS — S39012A Strain of muscle, fascia and tendon of lower back, initial encounter: Secondary | ICD-10-CM

## 2020-01-26 DIAGNOSIS — S50312A Abrasion of left elbow, initial encounter: Secondary | ICD-10-CM

## 2020-01-26 DIAGNOSIS — W010XXA Fall on same level from slipping, tripping and stumbling without subsequent striking against object, initial encounter: Secondary | ICD-10-CM | POA: Insufficient documentation

## 2020-01-26 DIAGNOSIS — M545 Low back pain, unspecified: Secondary | ICD-10-CM | POA: Insufficient documentation

## 2020-01-26 DIAGNOSIS — S5002XA Contusion of left elbow, initial encounter: Secondary | ICD-10-CM

## 2020-01-26 MED ORDER — ACETAMINOPHEN 325 MG PO TABS
650.0000 mg | ORAL_TABLET | Freq: Once | ORAL | Status: DC
  Filled 2020-01-26: qty 2

## 2020-01-26 NOTE — ED Triage Notes (Signed)
Pt states that he slipped and fell in a puddle of water yesterday around 5pm.  Pt reports soreness all over and pain in left elbow that feels like a "funny bone feeling".  No numbness or tingling in hands, pt is able to move elbow.

## 2020-01-26 NOTE — ED Provider Notes (Signed)
[facility] Provider Note   CSN: [id number] Arrival date & time: 01/26/20  1146     History Chief Complaint  Patient presents with  . Fall    Jonathan Macdonald is a 69 y.o. male.  Patient s/p fall yesterday evening, slipping on wet surface. Fell back, c/o left elbow, and neck and mid to lower back pain post fall. Symptoms acute onset, moderate, dull, non radiating. No associated numbness/weakness. No faintness or dizziness prior to fall. No loc with fall. No headache since fall.  No radicular pain. No chest pain or sob. No abd pain or nv. Small abrasion to left elbow. Pt indicates tetanus up to date within past 2-3 years. Denies other extremity pain or injury. No anticoag use.   The history is provided by the patient.  Fall Pertinent negatives include no chest pain, no abdominal pain, no headaches and no shortness of breath.       Past Medical History:  Diagnosis Date  . Bipolar illness ([place])   . Neuropathy     There are no problems to display for this patient.   History reviewed. No pertinent surgical history.     No family history on file.  Social History   Tobacco Use  . Smoking status: Current Every Day Smoker    Types: Cigars  . Smokeless tobacco: Never Used  Vaping Use  . Vaping Use: Never used  Substance Use Topics  . Alcohol use: Never  . Drug use: No    Home Medications Prior to Admission medications   Medication Sig Start Date End Date Taking? Authorizing Provider  amphetamine-dextroamphetamine (ADDERALL) 30 MG tablet Take by mouth.    [provider]  atorvastatin (LIPITOR) 40 MG tablet Take 40 mg by mouth daily.    [provider]  carbidopa-levodopa (SINEMET CR) 50-200 MG tablet Take 1 tablet by mouth at bedtime. 01/16/20   [provider]  escitalopram (LEXAPRO) 10 MG tablet Take 30 mg by mouth daily. 05/27/19   [provider]  esomeprazole (NEXIUM) 40 MG capsule Take 40 mg by mouth  daily. 05/10/19   [provider]  Oxycodone HCl 20 MG TABS Take 1 tablet by mouth 4 (four) times daily. 06/24/19   [provider]  pregabalin (LYRICA) 150 MG capsule Take 150 mg by mouth 3 (three) times daily. 06/27/19   [provider]  rOPINIRole (REQUIP) 1 MG tablet Take 1 mg by mouth at bedtime. 05/10/19   [provider]  tiZANidine (ZANAFLEX) 4 MG tablet  05/10/19   [provider]  UNKNOWN TO PATIENT abx for abscess     [provider]  zolpidem (AMBIEN) 10 MG tablet Take 10 mg by mouth at bedtime. 06/06/19   [provider]    Allergies    Patient has no known allergies.  Review of Systems   Review of Systems  Constitutional: Negative for fever.  HENT: Negative for sore throat.   Eyes: Negative for redness.  Respiratory: Negative for shortness of breath.   Cardiovascular: Negative for chest pain.  Gastrointestinal: Negative for abdominal pain, nausea and vomiting.  Genitourinary: Negative for flank pain.  Musculoskeletal: Positive for back pain and neck pain.  Skin: Positive for wound.  Neurological: Negative for weakness, numbness and headaches.  Hematological: Does not bruise/bleed easily.  Psychiatric/Behavioral: Negative for confusion.    Physical Exam Updated Vital Signs BP 119/71 (BP Location: Right Arm)   Pulse 69   Temp 98.3 F (36.8 C) (Oral)  Resp 12   Ht 1.676 m (5\' 6" )   Wt 70.3 kg   SpO2 99%   BMI 25.02 kg/m   Physical Exam Vitals and nursing note reviewed.  Constitutional:      Appearance: Normal appearance. He is well-developed.  HENT:     Head: Atraumatic.     Nose: Nose normal.     Mouth/Throat:     Mouth: Mucous membranes are moist.     Pharynx: Oropharynx is clear.  Eyes:     General: No scleral icterus.    Conjunctiva/sclera: Conjunctivae normal.     Pupils: Pupils are equal, round, and reactive to light.  Neck:     Trachea: No tracheal deviation.  Cardiovascular:     Rate  and Rhythm: Normal rate and regular rhythm.     Pulses: Normal pulses.     Heart sounds: Normal heart sounds. No murmur heard.  No friction rub. No gallop.   Pulmonary:     Effort: Pulmonary effort is normal. No accessory muscle usage or respiratory distress.     Breath sounds: Normal breath sounds.  Chest:     Chest wall: No tenderness.  Abdominal:     General: Bowel sounds are normal. There is no distension.     Palpations: Abdomen is soft.     Tenderness: There is no abdominal tenderness. There is no guarding.  Genitourinary:    Comments: No cva tenderness. Musculoskeletal:        General: No swelling.     Cervical back: Normal range of motion and neck supple. No rigidity.     Comments: Mid cervical tenderness, lower thoracic and upper lumbar tenderness, otherwise, CTLS spine, non tender, aligned, no step off. Tenderness left elbow, with small/superficial abrasion to left elbow. Distal pulses palp bil. No other focal extremity pain or tenderness.   Skin:    General: Skin is warm and dry.     Findings: No rash.  Neurological:     Mental Status: He is alert.     Comments: Alert, speech clear. GCS 15. Motor/sens grossly intact bil. Steady gait.   Psychiatric:        Mood and Affect: Mood normal.     ED Results / Procedures / Treatments   Labs (all labs ordered are listed, but only abnormal results are displayed) Labs Reviewed - No data to display  EKG None  Radiology DG Thoracic Spine 2 View  Result Date: 01/26/2020 CLINICAL DATA:  Back pain after fall EXAM: THORACIC SPINE 2 VIEWS COMPARISON:  02/15/2019 FINDINGS: There is no evidence of thoracic spine fracture. Alignment is normal. Multilevel disc height loss. Degenerative endplate spurring and endplate sclerosis most pronounced anteriorly at T11-12. No other significant bone abnormalities are identified. IMPRESSION: No acute fracture or subluxation of the thoracic spine. Multilevel degenerative disc disease. Electronically  Signed   By: Davina Poke D.O.   On: 01/26/2020 13:47   DG Lumbar Spine Complete  Result Date: 01/26/2020 CLINICAL DATA:  Low back pain after fall EXAM: LUMBAR SPINE - COMPLETE 4+ VIEW COMPARISON:  02/15/2019 FINDINGS: Five lumbar type vertebral segments. Vertebral body heights are maintained without evidence of fracture. Unchanged grade 1 anterolisthesis of L5 on S1 where there is advanced disc height loss. Moderate lower lumbar facet arthropathy. Abdominal aortic atherosclerosis. IMPRESSION: 1. No evidence of acute fracture of the lumbar spine. 2. Unchanged grade 1 anterolisthesis of L5 on S1 with advanced disc height loss. Electronically Signed   By: Davina Poke D.O.  On: 01/26/2020 13:46   DG Elbow Complete Left  Result Date: 01/26/2020 CLINICAL DATA:  Fall, elbow pain posterior elbow EXAM: LEFT ELBOW - COMPLETE 3+ VIEW COMPARISON:  None. FINDINGS: No acute fracture or dislocation. Joint spaces and alignment are maintained. No area of erosion or osseous destruction. No unexpected radiopaque foreign body. Small amount of soft tissue swelling overlying the olecranon. IMPRESSION: No acute fracture or dislocation. Electronically Signed   By: Valentino Saxon MD   On: 01/26/2020 12:33   CT CERVICAL SPINE WO CONTRAST  Result Date: 01/26/2020 CLINICAL DATA:  Neck pain after fall 1 day ago EXAM: CT CERVICAL SPINE WITHOUT CONTRAST TECHNIQUE: Multidetector CT imaging of the cervical spine was performed without intravenous contrast. Multiplanar CT image reconstructions were also generated. COMPARISON:  07/18/2019 FINDINGS: Alignment: Exaggerated cervical lordosis. Facet joints are aligned without dislocation. Dens and lateral masses aligned. Skull base and vertebrae: No acute fracture. No primary bone lesion or focal pathologic process. Soft tissues and spinal canal: No prevertebral fluid or swelling. No visible canal hematoma. Disc levels: No interval progression in the degree of degenerative disc  and right worse than left facet arthropathy within the cervical spine compared to prior. Upper chest: Visualized lung apices clear. Other: None. IMPRESSION: 1. No acute fracture or traumatic listhesis of the cervical spine. 2. Multilevel cervical spondylosis, similar to prior. Electronically Signed   By: Davina Poke D.O.   On: 01/26/2020 13:16    Procedures Procedures (including critical care time)  Medications Ordered in ED Medications - No data to display  ED Course  I have reviewed the triage vital signs and the nursing notes.  Pertinent labs & imaging results that were available during my care of the patient were reviewed by me and considered in my medical decision making (see chart for details).    MDM Rules/Calculators/A&P                          Imaging studies ordered.   Reviewed nursing notes and prior charts for additional history.   Acetaminophen po.  Xrays reviewed/interpreted by me - no fx.  CT reviewed/interpreted by me - no fx.   Abrasion cleaned, dressing applied.   Patient appears stable for d/c.    Final Clinical Impression(s) / ED Diagnoses Final diagnoses:  None    Rx / DC Orders ED Discharge Orders    None       Lajean Saver, MD 01/26/20 1418

## 2020-01-26 NOTE — Discharge Instructions (Signed)
It was our pleasure to provide your ER care today - we hope that you feel better.  Take acetaminophen or ibuprofen as need for pain.   Keep abrasion very clean.  Follow up with primary care doctor in 2-3 weeks if symptoms fail to improve/resolve.  Return to ER if worse, new symptoms, new or severe pain, or other concern.

## 2020-01-26 NOTE — ED Notes (Signed)
Reviewed D/C papers with pt and wife, pt denies questions at this time.

## 2020-07-22 ENCOUNTER — Emergency Department (HOSPITAL_BASED_OUTPATIENT_CLINIC_OR_DEPARTMENT_OTHER): Payer: Medicare Other

## 2020-07-22 ENCOUNTER — Encounter (HOSPITAL_BASED_OUTPATIENT_CLINIC_OR_DEPARTMENT_OTHER): Payer: Self-pay | Admitting: *Deleted

## 2020-07-22 ENCOUNTER — Emergency Department (HOSPITAL_BASED_OUTPATIENT_CLINIC_OR_DEPARTMENT_OTHER)
Admission: EM | Admit: 2020-07-22 | Discharge: 2020-07-22 | Disposition: A | Discharge status: Home / Self Care | Payer: Medicare Other | Attending: Emergency Medicine | Admitting: Emergency Medicine

## 2020-07-22 ENCOUNTER — Other Ambulatory Visit: Payer: Self-pay

## 2020-07-22 DIAGNOSIS — F17290 Nicotine dependence, other tobacco product, uncomplicated: Secondary | ICD-10-CM | POA: Diagnosis not present

## 2020-07-22 DIAGNOSIS — R066 Hiccough: Secondary | ICD-10-CM | POA: Diagnosis not present

## 2020-07-22 LAB — CBC WITH DIFFERENTIAL/PLATELET
Abs Immature Granulocytes: 0.01 10*3/uL (ref 0.00–0.07)
Basophils Absolute: 0 10*3/uL (ref 0.0–0.1)
Basophils Relative: 0 %
Eosinophils Absolute: 0 10*3/uL (ref 0.0–0.5)
Eosinophils Relative: 0 %
HCT: 44 % (ref 39.0–52.0)
Hemoglobin: 15 g/dL (ref 13.0–17.0)
Immature Granulocytes: 0 %
Lymphocytes Relative: 12 %
Lymphs Abs: 1 10*3/uL (ref 0.7–4.0)
MCH: 31.9 pg (ref 26.0–34.0)
MCHC: 34.1 g/dL (ref 30.0–36.0)
MCV: 93.6 fL (ref 80.0–100.0)
Monocytes Absolute: 0.3 10*3/uL (ref 0.1–1.0)
Monocytes Relative: 4 %
Neutro Abs: 6.9 10*3/uL (ref 1.7–7.7)
Neutrophils Relative %: 84 %
Platelets: 308 10*3/uL (ref 150–400)
RBC: 4.7 MIL/uL (ref 4.22–5.81)
RDW: 13.5 % (ref 11.5–15.5)
WBC: 8.3 10*3/uL (ref 4.0–10.5)
nRBC: 0 % (ref 0.0–0.2)

## 2020-07-22 LAB — COMPREHENSIVE METABOLIC PANEL
ALT: 22 U/L (ref 0–44)
AST: 25 U/L (ref 15–41)
Albumin: 4.2 g/dL (ref 3.5–5.0)
Alkaline Phosphatase: 72 U/L (ref 38–126)
Anion gap: 10 (ref 5–15)
BUN: 13 mg/dL (ref 8–23)
CO2: 24 mmol/L (ref 22–32)
Calcium: 8.7 mg/dL — ABNORMAL LOW (ref 8.9–10.3)
Chloride: 108 mmol/L (ref 98–111)
Creatinine, Ser: 0.62 mg/dL (ref 0.61–1.24)
GFR, Estimated: >60 mL/min (ref >60)
Glucose, Bld: 142 mg/dL — ABNORMAL HIGH (ref 70–99)
Potassium: 3.5 mmol/L (ref 3.5–5.1)
Sodium: 142 mmol/L (ref 135–145)
Total Bilirubin: 0.9 mg/dL (ref 0.3–1.2)
Total Protein: 7.3 g/dL (ref 6.5–8.1)

## 2020-07-22 MED ORDER — SODIUM CHLORIDE 0.9 % IV BOLUS
1000.0000 mL | Freq: Once | INTRAVENOUS | Status: AC
  Administered 2020-07-22: 1000 mL via INTRAVENOUS

## 2020-07-22 MED ORDER — DIPHENHYDRAMINE HCL 50 MG/ML IJ SOLN
25.0000 mg | Freq: Once | INTRAMUSCULAR | Status: AC
  Administered 2020-07-22: 25 mg via INTRAVENOUS
  Filled 2020-07-22: qty 1

## 2020-07-22 MED ORDER — METOCLOPRAMIDE HCL 5 MG/ML IJ SOLN
10.0000 mg | Freq: Once | INTRAMUSCULAR | Status: AC
  Administered 2020-07-22: 10 mg via INTRAVENOUS
  Filled 2020-07-22: qty 2

## 2020-07-22 MED ORDER — PANTOPRAZOLE SODIUM 40 MG IV SOLR
40.0000 mg | Freq: Once | INTRAVENOUS | Status: AC
  Administered 2020-07-22: 40 mg via INTRAVENOUS
  Filled 2020-07-22: qty 40

## 2020-07-22 MED ORDER — PROCHLORPERAZINE EDISYLATE 10 MG/2ML IJ SOLN
10.0000 mg | Freq: Once | INTRAMUSCULAR | Status: AC
  Administered 2020-07-22: 10 mg via INTRAVENOUS
  Filled 2020-07-22: qty 2

## 2020-07-22 MED ORDER — HALOPERIDOL LACTATE 5 MG/ML IJ SOLN
2.0000 mg | Freq: Once | INTRAMUSCULAR | Status: AC
  Administered 2020-07-22: 2 mg via INTRAVENOUS
  Filled 2020-07-22: qty 1

## 2020-07-22 NOTE — ED Provider Notes (Addendum)
[facility] Provider Note   CSN: [id number] Arrival date & time: 07/22/20  1236     History Chief Complaint  Patient presents with  . Hiccups    Jonathan Macdonald is a 70 y.o. male with past medical history of HLD, neuropathy, and chronic pain who presents the ED with complaints of hiccups.  On my examination, patient reports that 3 days ago he ran out of his proton pump inhibitor medications.  He has not been taking anything for his chronic GERD symptoms.  Yesterday he developed hiccups which she states has been intractable.  He has not been able to eat or drink anything today.  Whenever he eats, he throws the right back up and he attributes this to his hiccups.  Patient notes that this is happened in the past and has always been diagnosed as GERD.  Denies any known AVM or vagal nerve disturbance.  He is followed by a neurologist, but for neuropathy.  He suspects that this is related to not being on his antacid medications, but cannot take anything now by mouth due to his intractable symptoms.  He denies any fevers, chills, recent illness or infection, chest pain or shortness of breath, cough, abdominal pain, urinary symptoms, or changes in his bowel habits.  HPI     Past Medical History:  Diagnosis Date  . Bipolar illness ([place])   . Neuropathy     There are no problems to display for this patient.   History reviewed. No pertinent surgical history.     History reviewed. No pertinent family history.  Social History   Tobacco Use  . Smoking status: Current Every Day Smoker    Types: Cigars  . Smokeless tobacco: Never Used  Vaping Use  . Vaping Use: Never used  Substance Use Topics  . Alcohol use: Never  . Drug use: No    Home Medications Prior to Admission medications   Medication Sig Start Date End Date Taking? Authorizing Provider  amphetamine-dextroamphetamine (ADDERALL) 30 MG tablet Take by mouth.    [provider]   atorvastatin (LIPITOR) 40 MG tablet Take 40 mg by mouth daily.    [provider]  carbidopa-levodopa (SINEMET CR) 50-200 MG tablet Take 1 tablet by mouth at bedtime. 01/16/20   [provider]  escitalopram (LEXAPRO) 10 MG tablet Take 30 mg by mouth daily. 05/27/19   [provider]  esomeprazole (NEXIUM) 40 MG capsule Take 40 mg by mouth daily. 05/10/19   [provider]  Oxycodone HCl 20 MG TABS Take 1 tablet by mouth 4 (four) times daily. 06/24/19   [provider]  pregabalin (LYRICA) 150 MG capsule Take 150 mg by mouth 3 (three) times daily. 06/27/19   [provider]  rOPINIRole (REQUIP) 1 MG tablet Take 1 mg by mouth at bedtime. 05/10/19   [provider]  tiZANidine (ZANAFLEX) 4 MG tablet  05/10/19   [provider]  UNKNOWN TO PATIENT abx for abscess     [provider]  zolpidem (AMBIEN) 10 MG tablet Take 10 mg by mouth at bedtime. 06/06/19   [provider]    Allergies    Patient has no known allergies.  Review of Systems   Review of Systems  All other systems reviewed and are negative.   Physical Exam Updated Vital Signs BP (!) 153/87 (BP Location: Right Arm)   Pulse 83   Temp 98.2 F (36.8 C) (Oral)   Resp 20   Ht  5\' 6"  (1.676 m)   Wt 72.6 kg   SpO2 98%   BMI 25.82 kg/m   Physical Exam Vitals and nursing note reviewed. Exam conducted with a chaperone present.  Constitutional:      General: He is not in acute distress.    Appearance: Normal appearance.  HENT:     Head: Normocephalic and atraumatic.     Mouth/Throat:     Pharynx: Oropharynx is clear. No oropharyngeal exudate or posterior oropharyngeal erythema.  Eyes:     General: No scleral icterus.    Conjunctiva/sclera: Conjunctivae normal.  Cardiovascular:     Rate and Rhythm: Normal rate.     Pulses: Normal pulses.  Pulmonary:     Effort: Pulmonary effort is normal. No respiratory distress.  Abdominal:     General:  Abdomen is flat. There is no distension.     Palpations: Abdomen is soft.     Tenderness: There is no abdominal tenderness. There is no guarding.  Skin:    General: Skin is dry.  Neurological:     Mental Status: He is alert and oriented to person, place, and time.     GCS: GCS eye subscore is 4. GCS verbal subscore is 5. GCS motor subscore is 6.  Psychiatric:        Mood and Affect: Mood normal.        Behavior: Behavior normal.        Thought Content: Thought content normal.     ED Results / Procedures / Treatments   Labs (all labs ordered are listed, but only abnormal results are displayed) Labs Reviewed  COMPREHENSIVE METABOLIC PANEL - Abnormal; Notable for the following components:      Result Value   Glucose, Bld 142 (*)    Calcium 8.7 (*)    All other components within normal limits  CBC WITH DIFFERENTIAL/PLATELET    EKG None  Radiology DG Chest Portable 1 View  Result Date: 07/22/2020 CLINICAL DATA:  Arlyss Gandy since last night. Question diaphragm irritation. EXAM: PORTABLE CHEST 1 VIEW COMPARISON:  Radiographs 02/15/2019 and 12/30/2010.  CT 02/15/2019. FINDINGS: 1505 hours. The heart size and mediastinal contours are stable with aortic atherosclerosis. Previously demonstrated hiatal hernia is not as well seen and may be smaller. The lungs appear clear. There is no pleural effusion or pneumothorax. The bones appear unremarkable. IMPRESSION: No active cardiopulmonary process. Electronically Signed   By: Richardean Sale M.D.   On: 07/22/2020 15:58    Procedures Procedures   Medications Ordered in ED Medications  sodium chloride 0.9 % bolus 1,000 mL (0 mLs Intravenous Stopped 07/22/20 1550)  metoCLOPramide (REGLAN) injection 10 mg (10 mg Intravenous Given 07/22/20 1442)  pantoprazole (PROTONIX) injection 40 mg (40 mg Intravenous Given 07/22/20 1443)  diphenhydrAMINE (BENADRYL) injection 25 mg (25 mg Intravenous Given 07/22/20 1442)  haloperidol lactate (HALDOL) injection 2 mg (2  mg Intravenous Given 07/22/20 1527)  prochlorperazine (COMPAZINE) injection 10 mg (10 mg Intravenous Given 07/22/20 1633)    ED Course  I have reviewed the triage vital signs and the nursing notes.  Pertinent labs & imaging results that were available during my care of the patient were reviewed by me and considered in my medical decision making (see chart for details).    MDM Rules/Calculators/A&P                          Markos Theil was evaluated in Emergency Department on 07/22/2020 for the symptoms described in  the history of present illness. He was evaluated in the context of the global COVID-19 pandemic, which necessitated consideration that the patient might be at risk for infection with the SARS-CoV-2 virus that causes COVID-19. Institutional protocols and algorithms that pertain to the evaluation of patients at risk for COVID-19 are in a state of rapid change based on information released by regulatory bodies including the CDC and federal and state organizations. These policies and algorithms were followed during the patient's care in the ED.  I personally reviewed patient's medical chart and all notes from triage and staff during today's encounter. I have also ordered and reviewed all labs and imaging that I felt to be medically necessary in the evaluation of this patient's complaints and with consideration of their physical exam. If needed, translation services were available and utilized.   Patient with hiccups for the past 20 hours which has kept him from eating or drinking.  He states that he is very thirsty and hungry, but anytime he swallows food or liquid it comes right back up.  He states this is happened in the past, improved with antacid medications.  Reports that he ran out of his proton pump inhibitors a few days ago which he believes may have precipitated his symptoms.  Treating patient with IV Reglan, Benadryl, and Protonix.  Will provide 1 L IV NS given that he has been  vomiting and unable to tolerate any food or liquid by mouth x20 hours.  No history of CHF and he is clinically dehydrated.  Will assess basic laboratory work-up, but ultimately will plan to p.o. challenge and dispo home if he is feeling improved and labs are unremarkable.  On subsequent evaluation, patient is still having hiccups and vomiting whenever he attempts p.o. challenge.  Will administer 2 mg of Haldol IV.  Also obtain portable chest x-ray to evaluate for possible space-occupying lesion impinging upon diaphragm / evaluate for diaphragmatic irritation precipitating his hiccups.  He is tolerating secretions well on exam, no drooling.  Lower suspicion for esophageal impaction.  Patient states that his hiccups have improved during his stay here in the ED, however he is still having inability to tolerate any liquid or food by mouth.  He states that he would like to go home because when this has happened in the past, usually resolved after he takes a nap.  However, I expressed concern that he would go home and continue to have symptoms worsen him to return to the ED.  He is frustrated because he is overdue for his other medications prescribed for chronic pain and neuropathy.  Laboratory work-up is obtained and unremarkable.  Patient continues to have difficulty with p.o. challenges.  I advised that I would like him to be admitted to the hospital over at [facility] given his inability to eat and drink.  However, he insisted that he can go home and "sleep it off".  He states this is happened before and typically resolves with good rest which she has been unable to do here in the ER.  I emphasized my concern for obstructive process such as esophageal strictures requiring upper EGD and dilation, however patient states that he will simply go to [facility] if his symptoms do not improve and he is unable to sleep it off.  Discussed with Dr. Johnney Killian who agrees with this plan.  STRICT ED return precautions  discussed.    Final Clinical Impression(s) / ED Diagnoses Final diagnoses:  Intractable hiccups    Rx /  DC Orders ED Discharge Orders    None       Corena Herter, PA-C 07/22/20 1703    Corena Herter, PA-C 07/22/20 1717    Long, Wonda Old[facility], MD 07/24/20 1002

## 2020-07-22 NOTE — Discharge Instructions (Addendum)
You have been given multiple medications that can be sedating.  Please do not drive.  We have been unable to get you to eat and drink here in the ED despite multiple trials of medications.  At this point, he would need to be admitted over at [facility] and perhaps even have upper endoscopy performed by gastroenterologist or general surgery.  I know that at this time he would rather go home and try and sleep it off as this has helped in the past, however please have a very low threshold to return to the ED (at [facility]) if symptoms fail to improve and continue to persist.

## 2020-07-22 NOTE — ED Notes (Signed)
Pt restless on stretcher, demanding BP cuff be removed and POX removed, explained rationale for device monitoring due to medication, instructed pt to try to relax and stop talking, try closing eyes to aid in relaxation, lights dimmed in room. Told this RN to leave the room

## 2020-07-22 NOTE — ED Notes (Signed)
Placed on cont POX monitoring with int NBP assessments q 30 min, safety measures in place, IV med given at this time explained to client, instructed not to get up without staff in room, wife at bedside, sr x 2 up, call bell within reach

## 2020-07-22 NOTE — ED Triage Notes (Addendum)
Hiccups that started last night. Vomiting associated with hiccups.  Hx of same

## 2020-07-22 NOTE — ED Notes (Signed)
Pt tries to drink fluids, immediately vomits them back up. Donna Christen, PA at bedside speaking with patient.

## 2020-10-17 DIAGNOSIS — G8929 Other chronic pain: Secondary | ICD-10-CM | POA: Diagnosis not present

## 2020-10-17 DIAGNOSIS — M545 Low back pain, unspecified: Secondary | ICD-10-CM | POA: Diagnosis not present

## 2020-10-17 DIAGNOSIS — E782 Mixed hyperlipidemia: Secondary | ICD-10-CM | POA: Diagnosis not present

## 2020-10-17 DIAGNOSIS — G2581 Restless legs syndrome: Secondary | ICD-10-CM | POA: Diagnosis not present

## 2020-11-06 DIAGNOSIS — Z1211 Encounter for screening for malignant neoplasm of colon: Secondary | ICD-10-CM | POA: Diagnosis not present

## 2020-11-06 DIAGNOSIS — Z1212 Encounter for screening for malignant neoplasm of rectum: Secondary | ICD-10-CM | POA: Diagnosis not present

## 2020-11-29 DIAGNOSIS — G2581 Restless legs syndrome: Secondary | ICD-10-CM | POA: Diagnosis not present

## 2020-11-29 DIAGNOSIS — M545 Low back pain, unspecified: Secondary | ICD-10-CM | POA: Diagnosis not present

## 2020-11-29 DIAGNOSIS — R739 Hyperglycemia, unspecified: Secondary | ICD-10-CM | POA: Diagnosis not present

## 2020-11-29 DIAGNOSIS — E782 Mixed hyperlipidemia: Secondary | ICD-10-CM | POA: Diagnosis not present

## 2020-11-29 DIAGNOSIS — Z79899 Other long term (current) drug therapy: Secondary | ICD-10-CM | POA: Diagnosis not present

## 2020-11-29 DIAGNOSIS — G8929 Other chronic pain: Secondary | ICD-10-CM | POA: Diagnosis not present

## 2021-01-08 DIAGNOSIS — G2581 Restless legs syndrome: Secondary | ICD-10-CM | POA: Diagnosis not present

## 2021-01-08 DIAGNOSIS — Z79899 Other long term (current) drug therapy: Secondary | ICD-10-CM | POA: Diagnosis not present

## 2021-01-08 DIAGNOSIS — R0981 Nasal congestion: Secondary | ICD-10-CM | POA: Diagnosis not present

## 2021-01-08 DIAGNOSIS — G629 Polyneuropathy, unspecified: Secondary | ICD-10-CM | POA: Diagnosis not present

## 2021-01-08 DIAGNOSIS — G8929 Other chronic pain: Secondary | ICD-10-CM | POA: Diagnosis not present

## 2021-01-08 DIAGNOSIS — R252 Cramp and spasm: Secondary | ICD-10-CM | POA: Diagnosis not present

## 2021-01-08 DIAGNOSIS — R739 Hyperglycemia, unspecified: Secondary | ICD-10-CM | POA: Diagnosis not present

## 2021-01-08 DIAGNOSIS — E782 Mixed hyperlipidemia: Secondary | ICD-10-CM | POA: Diagnosis not present

## 2021-01-08 DIAGNOSIS — M545 Low back pain, unspecified: Secondary | ICD-10-CM | POA: Diagnosis not present

## 2021-02-06 DIAGNOSIS — R739 Hyperglycemia, unspecified: Secondary | ICD-10-CM | POA: Diagnosis not present

## 2021-02-06 DIAGNOSIS — M545 Low back pain, unspecified: Secondary | ICD-10-CM | POA: Diagnosis not present

## 2021-02-06 DIAGNOSIS — G8929 Other chronic pain: Secondary | ICD-10-CM | POA: Diagnosis not present

## 2021-02-06 DIAGNOSIS — Z79899 Other long term (current) drug therapy: Secondary | ICD-10-CM | POA: Diagnosis not present

## 2021-02-06 DIAGNOSIS — Z2821 Immunization not carried out because of patient refusal: Secondary | ICD-10-CM | POA: Diagnosis not present

## 2021-03-11 DIAGNOSIS — M545 Low back pain, unspecified: Secondary | ICD-10-CM | POA: Diagnosis not present

## 2021-03-11 DIAGNOSIS — G629 Polyneuropathy, unspecified: Secondary | ICD-10-CM | POA: Diagnosis not present

## 2021-03-11 DIAGNOSIS — G8929 Other chronic pain: Secondary | ICD-10-CM | POA: Diagnosis not present

## 2021-04-02 DIAGNOSIS — G629 Polyneuropathy, unspecified: Secondary | ICD-10-CM | POA: Diagnosis not present

## 2021-04-02 DIAGNOSIS — G8929 Other chronic pain: Secondary | ICD-10-CM | POA: Diagnosis not present

## 2021-04-02 DIAGNOSIS — Z79899 Other long term (current) drug therapy: Secondary | ICD-10-CM | POA: Diagnosis not present

## 2021-04-02 DIAGNOSIS — M545 Low back pain, unspecified: Secondary | ICD-10-CM | POA: Diagnosis not present

## 2021-04-29 DIAGNOSIS — M545 Low back pain, unspecified: Secondary | ICD-10-CM | POA: Diagnosis not present

## 2021-04-29 DIAGNOSIS — E782 Mixed hyperlipidemia: Secondary | ICD-10-CM | POA: Diagnosis not present

## 2021-04-29 DIAGNOSIS — G8929 Other chronic pain: Secondary | ICD-10-CM | POA: Diagnosis not present

## 2021-04-29 DIAGNOSIS — Z6824 Body mass index (BMI) 24.0-24.9, adult: Secondary | ICD-10-CM | POA: Diagnosis not present

## 2021-04-29 DIAGNOSIS — L0100 Impetigo, unspecified: Secondary | ICD-10-CM | POA: Diagnosis not present

## 2021-05-23 DIAGNOSIS — Z79899 Other long term (current) drug therapy: Secondary | ICD-10-CM | POA: Diagnosis not present

## 2021-05-23 DIAGNOSIS — G8929 Other chronic pain: Secondary | ICD-10-CM | POA: Diagnosis not present

## 2021-05-23 DIAGNOSIS — E782 Mixed hyperlipidemia: Secondary | ICD-10-CM | POA: Diagnosis not present

## 2021-05-23 DIAGNOSIS — R739 Hyperglycemia, unspecified: Secondary | ICD-10-CM | POA: Diagnosis not present

## 2021-05-23 DIAGNOSIS — G629 Polyneuropathy, unspecified: Secondary | ICD-10-CM | POA: Diagnosis not present

## 2021-05-23 DIAGNOSIS — Z87891 Personal history of nicotine dependence: Secondary | ICD-10-CM | POA: Diagnosis not present

## 2021-05-23 DIAGNOSIS — M545 Low back pain, unspecified: Secondary | ICD-10-CM | POA: Diagnosis not present

## 2021-05-23 DIAGNOSIS — E559 Vitamin D deficiency, unspecified: Secondary | ICD-10-CM | POA: Diagnosis not present

## 2021-06-19 DIAGNOSIS — E559 Vitamin D deficiency, unspecified: Secondary | ICD-10-CM | POA: Diagnosis not present

## 2021-06-19 DIAGNOSIS — R739 Hyperglycemia, unspecified: Secondary | ICD-10-CM | POA: Diagnosis not present

## 2021-06-19 DIAGNOSIS — G8929 Other chronic pain: Secondary | ICD-10-CM | POA: Diagnosis not present

## 2021-06-19 DIAGNOSIS — M545 Low back pain, unspecified: Secondary | ICD-10-CM | POA: Diagnosis not present

## 2021-06-19 DIAGNOSIS — E782 Mixed hyperlipidemia: Secondary | ICD-10-CM | POA: Diagnosis not present

## 2021-07-24 DIAGNOSIS — K219 Gastro-esophageal reflux disease without esophagitis: Secondary | ICD-10-CM | POA: Diagnosis not present

## 2021-07-24 DIAGNOSIS — M545 Low back pain, unspecified: Secondary | ICD-10-CM | POA: Diagnosis not present

## 2021-07-24 DIAGNOSIS — Z79899 Other long term (current) drug therapy: Secondary | ICD-10-CM | POA: Diagnosis not present

## 2021-07-24 DIAGNOSIS — G8929 Other chronic pain: Secondary | ICD-10-CM | POA: Diagnosis not present

## 2021-08-13 DIAGNOSIS — H25811 Combined forms of age-related cataract, right eye: Secondary | ICD-10-CM | POA: Diagnosis not present

## 2021-08-22 DIAGNOSIS — Z139 Encounter for screening, unspecified: Secondary | ICD-10-CM | POA: Diagnosis not present

## 2021-08-22 DIAGNOSIS — Z9181 History of falling: Secondary | ICD-10-CM | POA: Diagnosis not present

## 2021-08-22 DIAGNOSIS — K219 Gastro-esophageal reflux disease without esophagitis: Secondary | ICD-10-CM | POA: Diagnosis not present

## 2021-08-22 DIAGNOSIS — M545 Low back pain, unspecified: Secondary | ICD-10-CM | POA: Diagnosis not present

## 2021-08-22 DIAGNOSIS — G8929 Other chronic pain: Secondary | ICD-10-CM | POA: Diagnosis not present

## 2021-09-11 DIAGNOSIS — Z9181 History of falling: Secondary | ICD-10-CM | POA: Diagnosis not present

## 2021-09-11 DIAGNOSIS — Z0000 Encounter for general adult medical examination without abnormal findings: Secondary | ICD-10-CM | POA: Diagnosis not present

## 2021-09-11 DIAGNOSIS — E785 Hyperlipidemia, unspecified: Secondary | ICD-10-CM | POA: Diagnosis not present

## 2021-09-19 DIAGNOSIS — G629 Polyneuropathy, unspecified: Secondary | ICD-10-CM | POA: Diagnosis not present

## 2021-09-19 DIAGNOSIS — G8929 Other chronic pain: Secondary | ICD-10-CM | POA: Diagnosis not present

## 2021-09-19 DIAGNOSIS — Z79899 Other long term (current) drug therapy: Secondary | ICD-10-CM | POA: Diagnosis not present

## 2021-09-19 DIAGNOSIS — M545 Low back pain, unspecified: Secondary | ICD-10-CM | POA: Diagnosis not present

## 2021-10-16 DIAGNOSIS — Z79899 Other long term (current) drug therapy: Secondary | ICD-10-CM | POA: Diagnosis not present

## 2021-10-16 DIAGNOSIS — M545 Low back pain, unspecified: Secondary | ICD-10-CM | POA: Diagnosis not present

## 2021-10-16 DIAGNOSIS — G8929 Other chronic pain: Secondary | ICD-10-CM | POA: Diagnosis not present

## 2021-10-16 DIAGNOSIS — E782 Mixed hyperlipidemia: Secondary | ICD-10-CM | POA: Diagnosis not present

## 2021-10-16 DIAGNOSIS — G629 Polyneuropathy, unspecified: Secondary | ICD-10-CM | POA: Diagnosis not present

## 2021-10-16 DIAGNOSIS — R739 Hyperglycemia, unspecified: Secondary | ICD-10-CM | POA: Diagnosis not present

## 2021-11-13 DIAGNOSIS — G8929 Other chronic pain: Secondary | ICD-10-CM | POA: Diagnosis not present

## 2021-11-13 DIAGNOSIS — M545 Low back pain, unspecified: Secondary | ICD-10-CM | POA: Diagnosis not present

## 2021-11-13 DIAGNOSIS — E782 Mixed hyperlipidemia: Secondary | ICD-10-CM | POA: Diagnosis not present

## 2021-11-13 DIAGNOSIS — R739 Hyperglycemia, unspecified: Secondary | ICD-10-CM | POA: Diagnosis not present

## 2021-11-13 DIAGNOSIS — G629 Polyneuropathy, unspecified: Secondary | ICD-10-CM | POA: Diagnosis not present

## 2021-12-12 IMAGING — CT CT CERVICAL SPINE W/O CM
4 of 8 series · 14 of 33 positions shown, 15 images · non-contrast
Comparison: 07/18/2019

CLINICAL DATA: Neck pain after fall 1 day ago

EXAM:
CT CERVICAL SPINE WITHOUT CONTRAST
TECHNIQUE: Multidetector CT imaging of the cervical spine was performed without
intravenous contrast. Multiplanar CT image reconstructions were also
generated.

[Series 3: c spine soft · axial · 0.36mm/px · z∈[-162,-78]mm · 3 of 85 slices shown]
[im 22/85  soft-tissue]
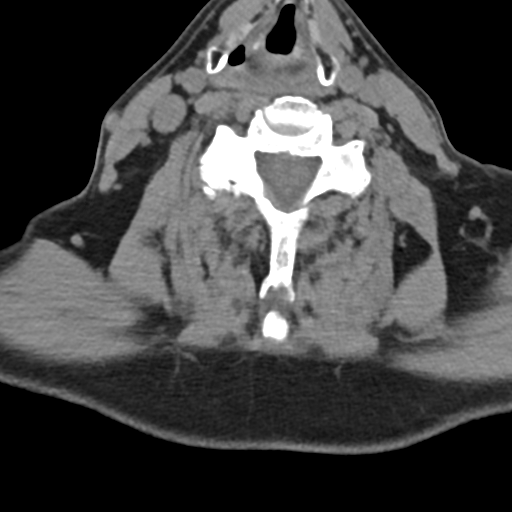
[im 43/85  soft-tissue]
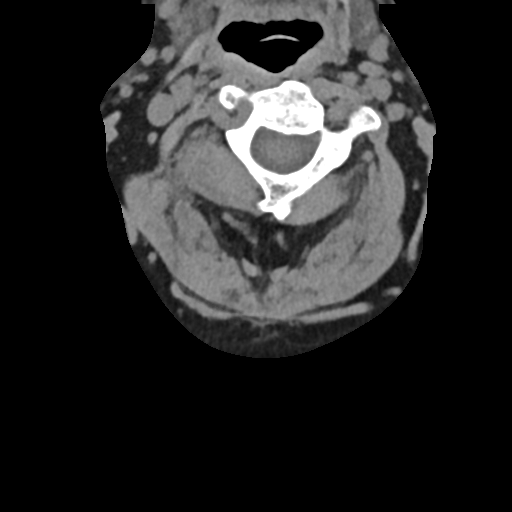
[im 64/85  soft-tissue]
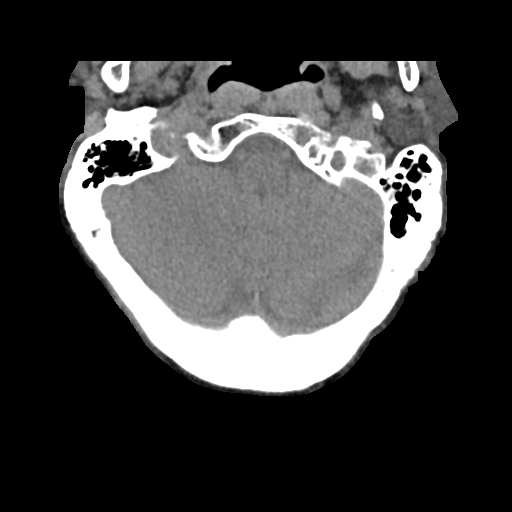

[Series 4: sagittal bone · sagittal · 0.33mm/px · 5 of 90 slices shown]
[im 15/90  bone]
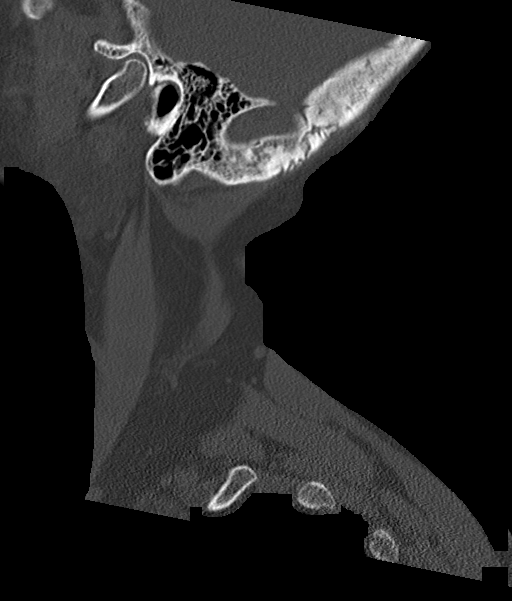
[im 30/90  bone]
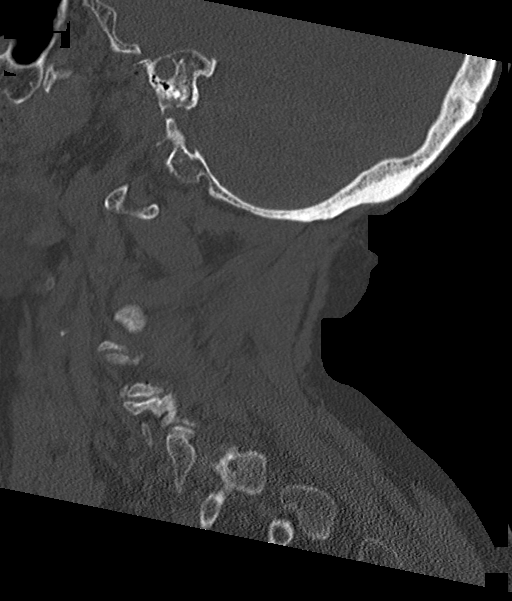
[im 45/90  bone]
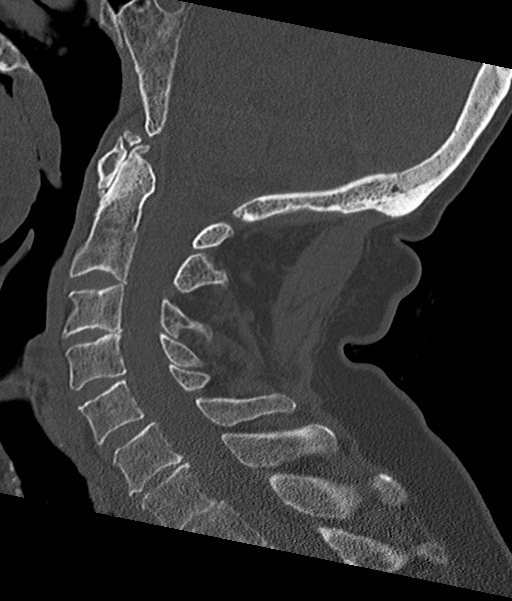
[im 60/90  bone]
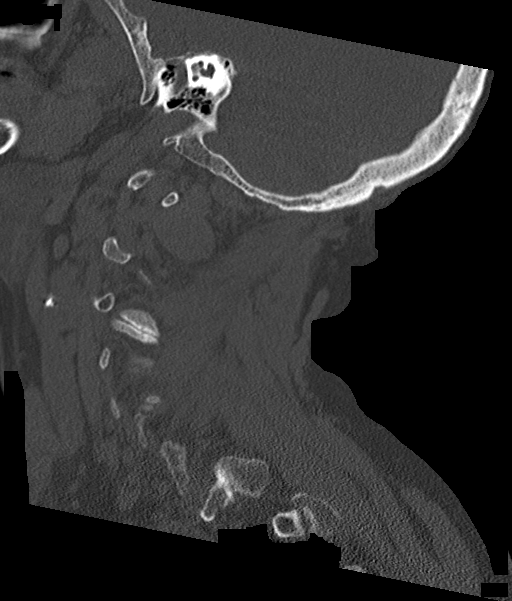
[im 75/90  bone]
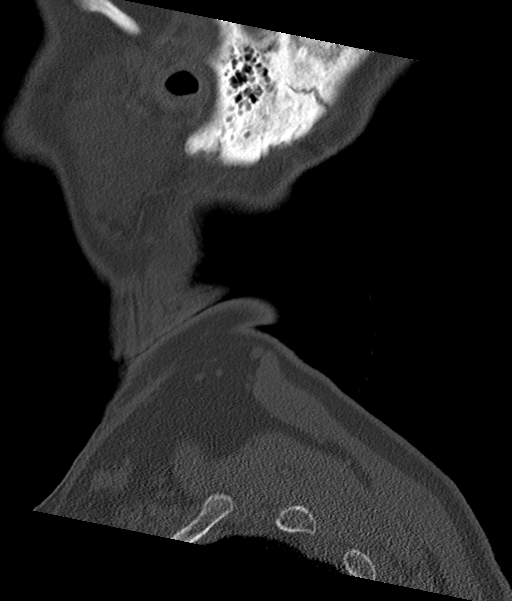

[Series 5: coronal bone · coronal · 0.36mm/px · 3 of 76 slices shown]
[im 19/76  bone]
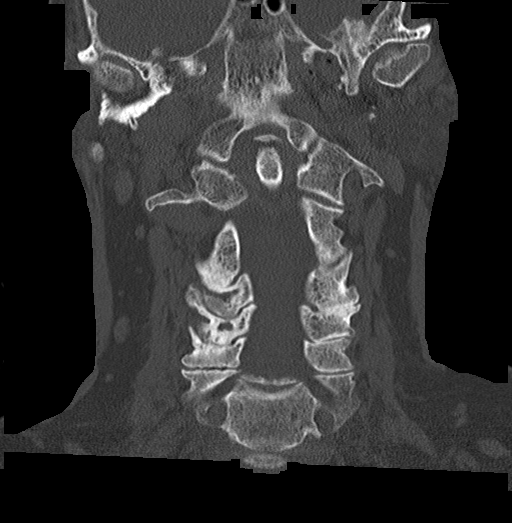
[im 38/76  bone]
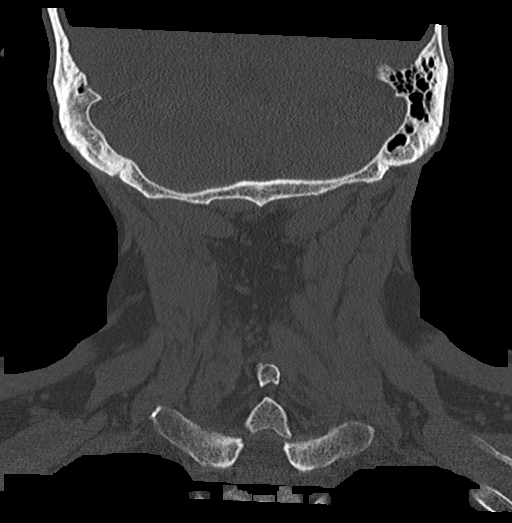
[im 57/76  bone]
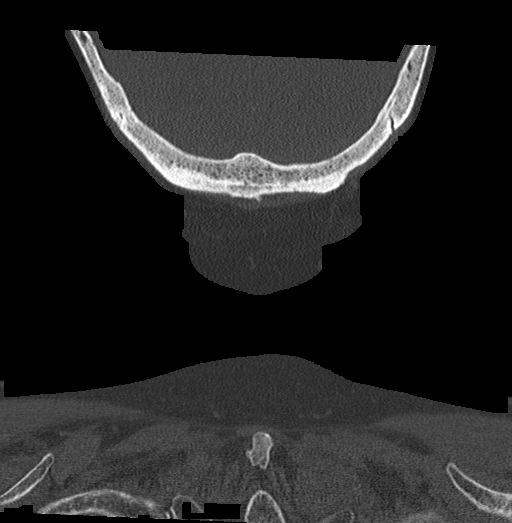

[Series 6: orthogonal bone · axial · 0.30mm/px · z∈[-197,-103]mm · 3 of 98 slices shown, 4 images]
[im 25/98  soft-tissue]
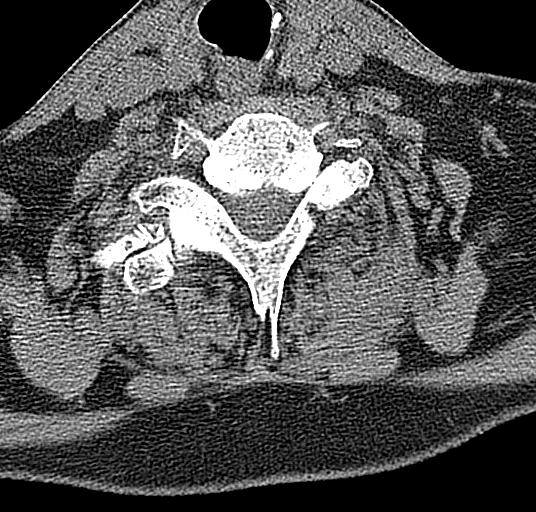
[im 25/98  bone]
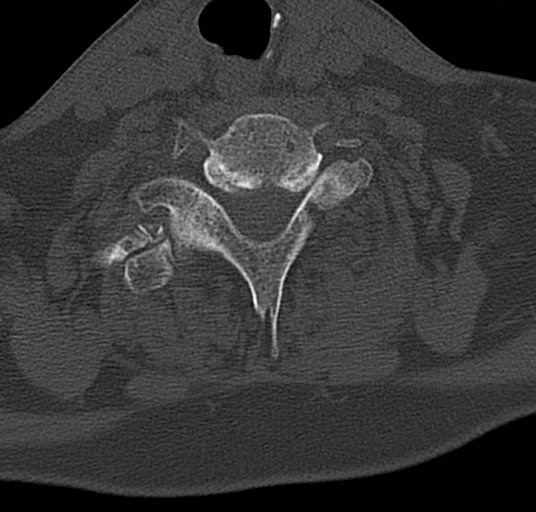
[im 49/98  bone]
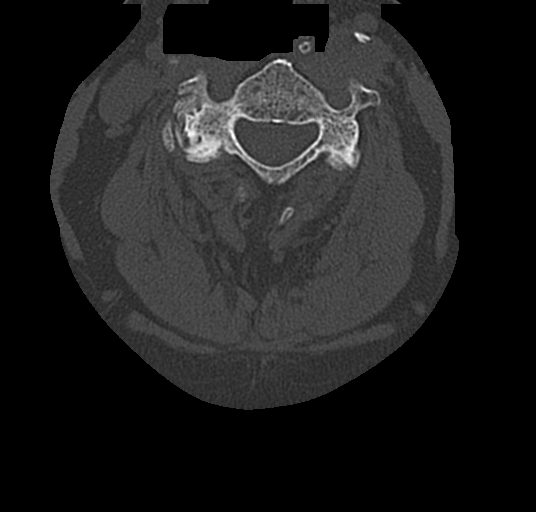
[im 73/98  bone]
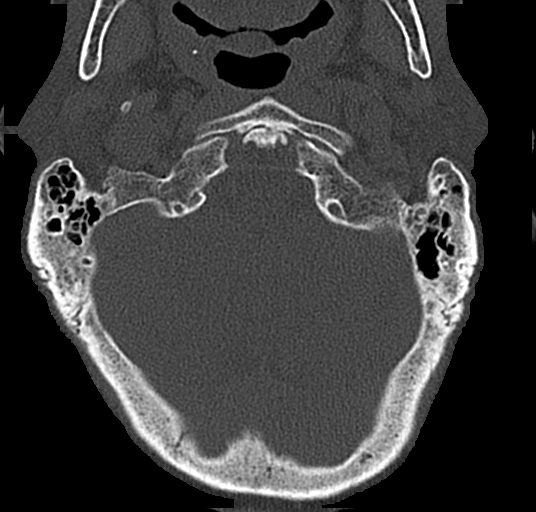

[14 of 33 positions shown; findings below may reference images not displayed]

FINDINGS: Alignment: Exaggerated cervical lordosis. Facet joints are aligned
without dislocation. Dens and lateral masses aligned.

Skull base and vertebrae: No acute fracture. No primary bone lesion
or focal pathologic process.

Soft tissues and spinal canal: No prevertebral fluid or swelling. No
visible canal hematoma.

Disc levels: No interval progression in the degree of degenerative
disc and right worse than left facet arthropathy within the cervical
spine compared to prior.

Upper chest: Visualized lung apices clear.

Other: None.
IMPRESSION: 1. No acute fracture or traumatic listhesis of the cervical spine.
2. Multilevel cervical spondylosis, similar to prior.

## 2021-12-16 DIAGNOSIS — G8929 Other chronic pain: Secondary | ICD-10-CM | POA: Diagnosis not present

## 2021-12-16 DIAGNOSIS — Z79899 Other long term (current) drug therapy: Secondary | ICD-10-CM | POA: Diagnosis not present

## 2021-12-16 DIAGNOSIS — R739 Hyperglycemia, unspecified: Secondary | ICD-10-CM | POA: Diagnosis not present

## 2021-12-16 DIAGNOSIS — M545 Low back pain, unspecified: Secondary | ICD-10-CM | POA: Diagnosis not present

## 2021-12-16 DIAGNOSIS — E782 Mixed hyperlipidemia: Secondary | ICD-10-CM | POA: Diagnosis not present

## 2022-01-15 DIAGNOSIS — R739 Hyperglycemia, unspecified: Secondary | ICD-10-CM | POA: Diagnosis not present

## 2022-01-15 DIAGNOSIS — G8929 Other chronic pain: Secondary | ICD-10-CM | POA: Diagnosis not present

## 2022-01-15 DIAGNOSIS — M545 Low back pain, unspecified: Secondary | ICD-10-CM | POA: Diagnosis not present

## 2022-02-13 DIAGNOSIS — E559 Vitamin D deficiency, unspecified: Secondary | ICD-10-CM | POA: Diagnosis not present

## 2022-02-13 DIAGNOSIS — G8929 Other chronic pain: Secondary | ICD-10-CM | POA: Diagnosis not present

## 2022-02-13 DIAGNOSIS — E782 Mixed hyperlipidemia: Secondary | ICD-10-CM | POA: Diagnosis not present

## 2022-02-13 DIAGNOSIS — R739 Hyperglycemia, unspecified: Secondary | ICD-10-CM | POA: Diagnosis not present

## 2022-02-13 DIAGNOSIS — M545 Low back pain, unspecified: Secondary | ICD-10-CM | POA: Diagnosis not present

## 2022-02-13 DIAGNOSIS — Z79899 Other long term (current) drug therapy: Secondary | ICD-10-CM | POA: Diagnosis not present

## 2022-03-12 DIAGNOSIS — G8929 Other chronic pain: Secondary | ICD-10-CM | POA: Diagnosis not present

## 2022-03-12 DIAGNOSIS — E559 Vitamin D deficiency, unspecified: Secondary | ICD-10-CM | POA: Diagnosis not present

## 2022-03-12 DIAGNOSIS — M545 Low back pain, unspecified: Secondary | ICD-10-CM | POA: Diagnosis not present

## 2022-03-12 DIAGNOSIS — E782 Mixed hyperlipidemia: Secondary | ICD-10-CM | POA: Diagnosis not present

## 2022-03-12 DIAGNOSIS — R739 Hyperglycemia, unspecified: Secondary | ICD-10-CM | POA: Diagnosis not present

## 2022-04-10 DIAGNOSIS — M545 Low back pain, unspecified: Secondary | ICD-10-CM | POA: Diagnosis not present

## 2022-04-10 DIAGNOSIS — Z79899 Other long term (current) drug therapy: Secondary | ICD-10-CM | POA: Diagnosis not present

## 2022-04-10 DIAGNOSIS — G8929 Other chronic pain: Secondary | ICD-10-CM | POA: Diagnosis not present

## 2022-05-12 DIAGNOSIS — R739 Hyperglycemia, unspecified: Secondary | ICD-10-CM | POA: Diagnosis not present

## 2022-05-12 DIAGNOSIS — G8929 Other chronic pain: Secondary | ICD-10-CM | POA: Diagnosis not present

## 2022-05-12 DIAGNOSIS — M545 Low back pain, unspecified: Secondary | ICD-10-CM | POA: Diagnosis not present

## 2022-05-12 DIAGNOSIS — E559 Vitamin D deficiency, unspecified: Secondary | ICD-10-CM | POA: Diagnosis not present

## 2022-05-12 DIAGNOSIS — E782 Mixed hyperlipidemia: Secondary | ICD-10-CM | POA: Diagnosis not present

## 2022-05-12 DIAGNOSIS — Z79899 Other long term (current) drug therapy: Secondary | ICD-10-CM | POA: Diagnosis not present

## 2022-06-08 IMAGING — DX DG CHEST 1V PORT
1 series · 1 of 1 positions shown · non-contrast
Comparison: Radiographs 02/15/2019 and 12/30/2010.  CT 02/15/2019.

CLINICAL DATA: Pickup since last night. Question diaphragm
irritation.

EXAM:
PORTABLE CHEST 1 VIEW

[chest ap]
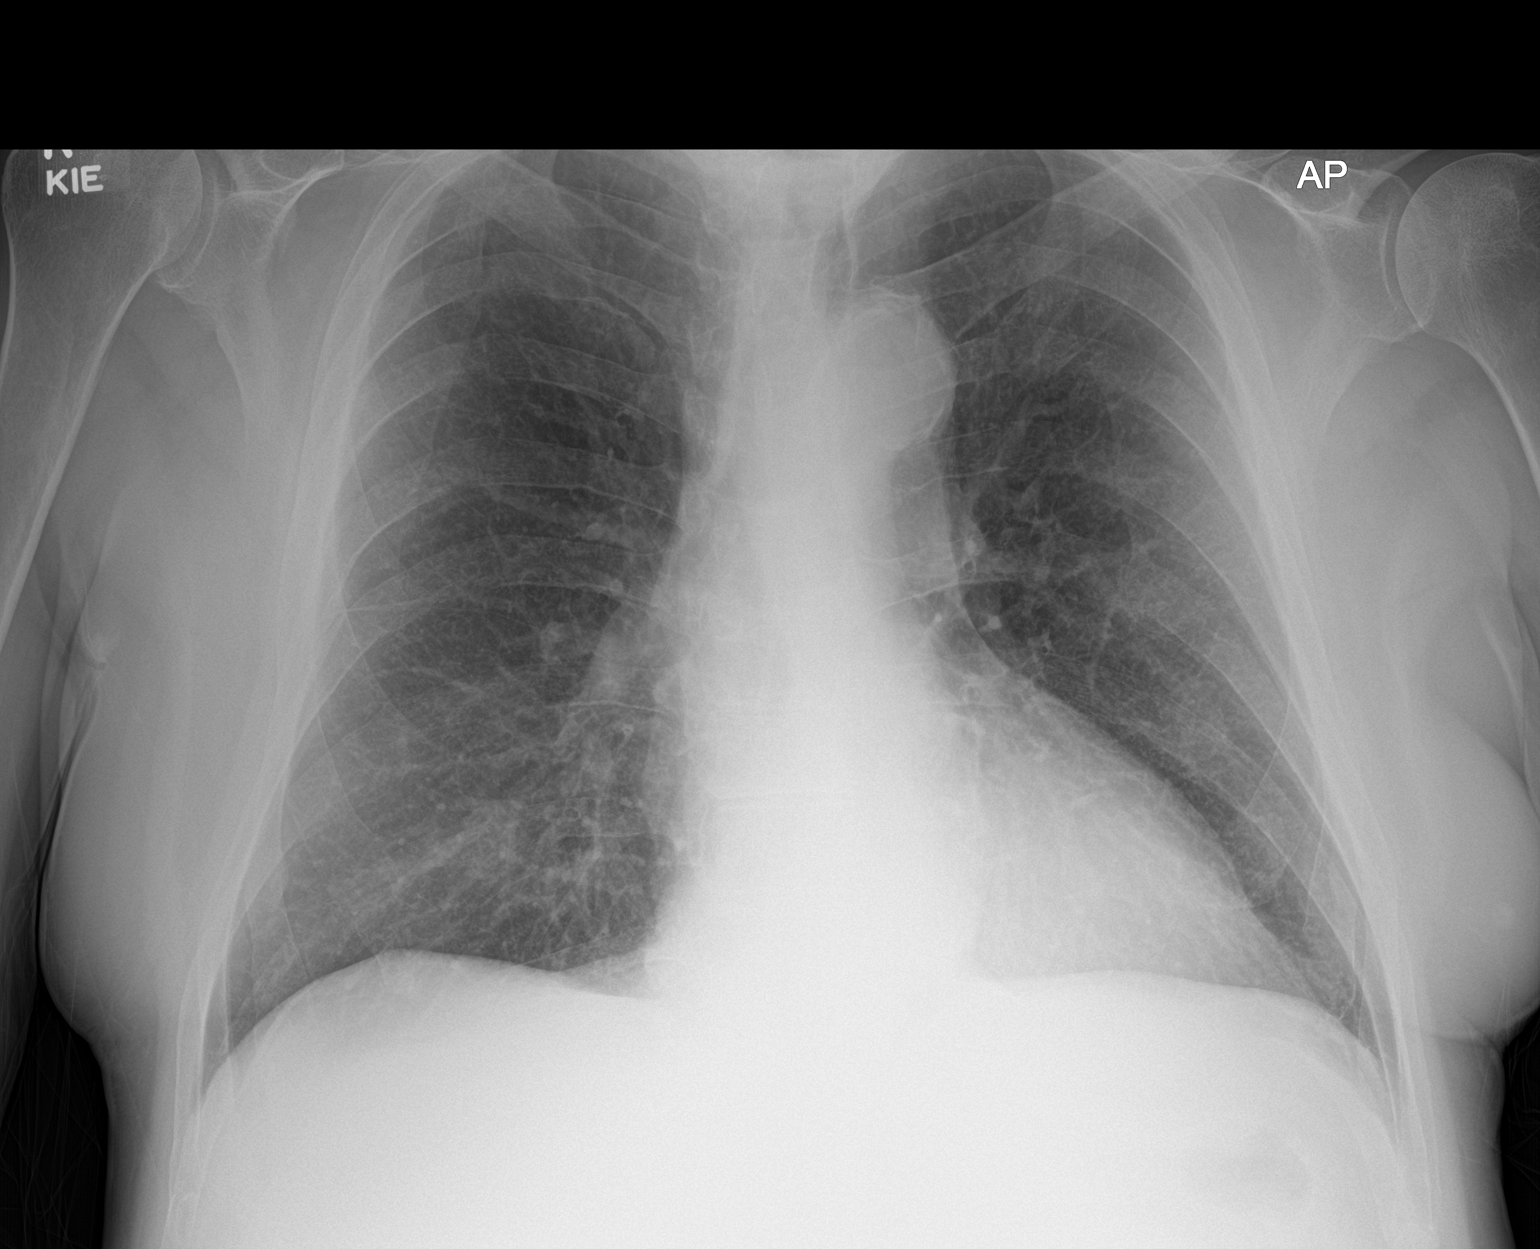

[1 of 1 positions shown; findings below may reference images not displayed]

FINDINGS: 3585 hours. The heart size and mediastinal contours are stable with
aortic atherosclerosis. Previously demonstrated hiatal hernia is not
as well seen and may be smaller. The lungs appear clear. There is no
pleural effusion or pneumothorax. The bones appear unremarkable.
IMPRESSION: No active cardiopulmonary process.

## 2022-06-11 DIAGNOSIS — M545 Low back pain, unspecified: Secondary | ICD-10-CM | POA: Diagnosis not present

## 2022-06-11 DIAGNOSIS — R739 Hyperglycemia, unspecified: Secondary | ICD-10-CM | POA: Diagnosis not present

## 2022-06-11 DIAGNOSIS — G8929 Other chronic pain: Secondary | ICD-10-CM | POA: Diagnosis not present

## 2022-06-11 DIAGNOSIS — Z79899 Other long term (current) drug therapy: Secondary | ICD-10-CM | POA: Diagnosis not present

## 2022-07-07 DIAGNOSIS — M545 Low back pain, unspecified: Secondary | ICD-10-CM | POA: Diagnosis not present

## 2022-07-07 DIAGNOSIS — G8929 Other chronic pain: Secondary | ICD-10-CM | POA: Diagnosis not present

## 2022-08-04 DIAGNOSIS — R739 Hyperglycemia, unspecified: Secondary | ICD-10-CM | POA: Diagnosis not present

## 2022-08-04 DIAGNOSIS — Z79899 Other long term (current) drug therapy: Secondary | ICD-10-CM | POA: Diagnosis not present

## 2022-08-04 DIAGNOSIS — E559 Vitamin D deficiency, unspecified: Secondary | ICD-10-CM | POA: Diagnosis not present

## 2022-08-04 DIAGNOSIS — M545 Low back pain, unspecified: Secondary | ICD-10-CM | POA: Diagnosis not present

## 2022-08-04 DIAGNOSIS — G629 Polyneuropathy, unspecified: Secondary | ICD-10-CM | POA: Diagnosis not present

## 2022-08-04 DIAGNOSIS — G8929 Other chronic pain: Secondary | ICD-10-CM | POA: Diagnosis not present

## 2022-08-04 DIAGNOSIS — E782 Mixed hyperlipidemia: Secondary | ICD-10-CM | POA: Diagnosis not present

## 2022-08-28 DIAGNOSIS — G8929 Other chronic pain: Secondary | ICD-10-CM | POA: Diagnosis not present

## 2022-08-28 DIAGNOSIS — M545 Low back pain, unspecified: Secondary | ICD-10-CM | POA: Diagnosis not present

## 2022-08-28 DIAGNOSIS — Z139 Encounter for screening, unspecified: Secondary | ICD-10-CM | POA: Diagnosis not present

## 2022-09-25 DIAGNOSIS — R0981 Nasal congestion: Secondary | ICD-10-CM | POA: Diagnosis not present

## 2022-09-25 DIAGNOSIS — M545 Low back pain, unspecified: Secondary | ICD-10-CM | POA: Diagnosis not present

## 2022-09-25 DIAGNOSIS — G8929 Other chronic pain: Secondary | ICD-10-CM | POA: Diagnosis not present

## 2022-10-27 DIAGNOSIS — E559 Vitamin D deficiency, unspecified: Secondary | ICD-10-CM | POA: Diagnosis not present

## 2022-10-27 DIAGNOSIS — E782 Mixed hyperlipidemia: Secondary | ICD-10-CM | POA: Diagnosis not present

## 2022-10-27 DIAGNOSIS — R739 Hyperglycemia, unspecified: Secondary | ICD-10-CM | POA: Diagnosis not present

## 2022-10-27 DIAGNOSIS — G8929 Other chronic pain: Secondary | ICD-10-CM | POA: Diagnosis not present

## 2022-10-27 DIAGNOSIS — Z79899 Other long term (current) drug therapy: Secondary | ICD-10-CM | POA: Diagnosis not present

## 2022-10-27 DIAGNOSIS — M545 Low back pain, unspecified: Secondary | ICD-10-CM | POA: Diagnosis not present

## 2022-11-26 DIAGNOSIS — R739 Hyperglycemia, unspecified: Secondary | ICD-10-CM | POA: Diagnosis not present

## 2022-11-26 DIAGNOSIS — M545 Low back pain, unspecified: Secondary | ICD-10-CM | POA: Diagnosis not present

## 2022-11-26 DIAGNOSIS — E782 Mixed hyperlipidemia: Secondary | ICD-10-CM | POA: Diagnosis not present

## 2022-11-26 DIAGNOSIS — G8929 Other chronic pain: Secondary | ICD-10-CM | POA: Diagnosis not present

## 2022-12-24 DIAGNOSIS — Z79899 Other long term (current) drug therapy: Secondary | ICD-10-CM | POA: Diagnosis not present

## 2022-12-24 DIAGNOSIS — R739 Hyperglycemia, unspecified: Secondary | ICD-10-CM | POA: Diagnosis not present

## 2022-12-24 DIAGNOSIS — M545 Low back pain, unspecified: Secondary | ICD-10-CM | POA: Diagnosis not present

## 2022-12-24 DIAGNOSIS — G8929 Other chronic pain: Secondary | ICD-10-CM | POA: Diagnosis not present

## 2022-12-24 DIAGNOSIS — Z9181 History of falling: Secondary | ICD-10-CM | POA: Diagnosis not present

## 2023-01-21 DIAGNOSIS — E559 Vitamin D deficiency, unspecified: Secondary | ICD-10-CM | POA: Diagnosis not present

## 2023-01-21 DIAGNOSIS — Z79899 Other long term (current) drug therapy: Secondary | ICD-10-CM | POA: Diagnosis not present

## 2023-01-21 DIAGNOSIS — M545 Low back pain, unspecified: Secondary | ICD-10-CM | POA: Diagnosis not present

## 2023-01-21 DIAGNOSIS — E782 Mixed hyperlipidemia: Secondary | ICD-10-CM | POA: Diagnosis not present

## 2023-01-21 DIAGNOSIS — Z2821 Immunization not carried out because of patient refusal: Secondary | ICD-10-CM | POA: Diagnosis not present

## 2023-01-21 DIAGNOSIS — R739 Hyperglycemia, unspecified: Secondary | ICD-10-CM | POA: Diagnosis not present

## 2023-01-21 DIAGNOSIS — G8929 Other chronic pain: Secondary | ICD-10-CM | POA: Diagnosis not present

## 2023-02-20 DIAGNOSIS — G8929 Other chronic pain: Secondary | ICD-10-CM | POA: Diagnosis not present

## 2023-02-20 DIAGNOSIS — E782 Mixed hyperlipidemia: Secondary | ICD-10-CM | POA: Diagnosis not present

## 2023-02-20 DIAGNOSIS — M545 Low back pain, unspecified: Secondary | ICD-10-CM | POA: Diagnosis not present

## 2023-02-20 DIAGNOSIS — E559 Vitamin D deficiency, unspecified: Secondary | ICD-10-CM | POA: Diagnosis not present

## 2023-03-30 DIAGNOSIS — M545 Low back pain, unspecified: Secondary | ICD-10-CM | POA: Diagnosis not present

## 2023-03-30 DIAGNOSIS — Z79899 Other long term (current) drug therapy: Secondary | ICD-10-CM | POA: Diagnosis not present

## 2023-03-30 DIAGNOSIS — G8929 Other chronic pain: Secondary | ICD-10-CM | POA: Diagnosis not present

## 2023-04-13 DIAGNOSIS — Z0000 Encounter for general adult medical examination without abnormal findings: Secondary | ICD-10-CM | POA: Diagnosis not present

## 2023-04-13 DIAGNOSIS — Z9181 History of falling: Secondary | ICD-10-CM | POA: Diagnosis not present

## 2023-04-27 DIAGNOSIS — E559 Vitamin D deficiency, unspecified: Secondary | ICD-10-CM | POA: Diagnosis not present

## 2023-04-27 DIAGNOSIS — G8929 Other chronic pain: Secondary | ICD-10-CM | POA: Diagnosis not present

## 2023-04-27 DIAGNOSIS — R739 Hyperglycemia, unspecified: Secondary | ICD-10-CM | POA: Diagnosis not present

## 2023-04-27 DIAGNOSIS — Z79899 Other long term (current) drug therapy: Secondary | ICD-10-CM | POA: Diagnosis not present

## 2023-04-27 DIAGNOSIS — M545 Low back pain, unspecified: Secondary | ICD-10-CM | POA: Diagnosis not present

## 2023-04-27 DIAGNOSIS — E782 Mixed hyperlipidemia: Secondary | ICD-10-CM | POA: Diagnosis not present

## 2023-05-11 DIAGNOSIS — Z129 Encounter for screening for malignant neoplasm, site unspecified: Secondary | ICD-10-CM | POA: Diagnosis not present

## 2023-05-11 DIAGNOSIS — D3611 Benign neoplasm of peripheral nerves and autonomic nervous system of face, head, and neck: Secondary | ICD-10-CM | POA: Diagnosis not present

## 2023-05-11 DIAGNOSIS — D225 Melanocytic nevi of trunk: Secondary | ICD-10-CM | POA: Diagnosis not present

## 2023-05-11 DIAGNOSIS — L570 Actinic keratosis: Secondary | ICD-10-CM | POA: Diagnosis not present

## 2023-05-11 DIAGNOSIS — D485 Neoplasm of uncertain behavior of skin: Secondary | ICD-10-CM | POA: Diagnosis not present

## 2023-05-11 DIAGNOSIS — L814 Other melanin hyperpigmentation: Secondary | ICD-10-CM | POA: Diagnosis not present

## 2023-05-11 DIAGNOSIS — L821 Other seborrheic keratosis: Secondary | ICD-10-CM | POA: Diagnosis not present

## 2023-05-25 DIAGNOSIS — M545 Low back pain, unspecified: Secondary | ICD-10-CM | POA: Diagnosis not present

## 2023-05-25 DIAGNOSIS — K219 Gastro-esophageal reflux disease without esophagitis: Secondary | ICD-10-CM | POA: Diagnosis not present

## 2023-05-25 DIAGNOSIS — E782 Mixed hyperlipidemia: Secondary | ICD-10-CM | POA: Diagnosis not present

## 2023-05-25 DIAGNOSIS — G8929 Other chronic pain: Secondary | ICD-10-CM | POA: Diagnosis not present

## 2023-05-25 DIAGNOSIS — G629 Polyneuropathy, unspecified: Secondary | ICD-10-CM | POA: Diagnosis not present

## 2023-06-24 DIAGNOSIS — G8929 Other chronic pain: Secondary | ICD-10-CM | POA: Diagnosis not present

## 2023-06-24 DIAGNOSIS — R739 Hyperglycemia, unspecified: Secondary | ICD-10-CM | POA: Diagnosis not present

## 2023-06-24 DIAGNOSIS — Z79899 Other long term (current) drug therapy: Secondary | ICD-10-CM | POA: Diagnosis not present

## 2023-06-24 DIAGNOSIS — M545 Low back pain, unspecified: Secondary | ICD-10-CM | POA: Diagnosis not present

## 2023-07-30 DIAGNOSIS — M545 Low back pain, unspecified: Secondary | ICD-10-CM | POA: Diagnosis not present

## 2023-07-30 DIAGNOSIS — R739 Hyperglycemia, unspecified: Secondary | ICD-10-CM | POA: Diagnosis not present

## 2023-07-30 DIAGNOSIS — Z79899 Other long term (current) drug therapy: Secondary | ICD-10-CM | POA: Diagnosis not present

## 2023-07-30 DIAGNOSIS — G8929 Other chronic pain: Secondary | ICD-10-CM | POA: Diagnosis not present

## 2023-07-30 DIAGNOSIS — E559 Vitamin D deficiency, unspecified: Secondary | ICD-10-CM | POA: Diagnosis not present

## 2023-07-30 DIAGNOSIS — E782 Mixed hyperlipidemia: Secondary | ICD-10-CM | POA: Diagnosis not present

## 2023-08-26 DIAGNOSIS — R739 Hyperglycemia, unspecified: Secondary | ICD-10-CM | POA: Diagnosis not present

## 2023-08-26 DIAGNOSIS — G8929 Other chronic pain: Secondary | ICD-10-CM | POA: Diagnosis not present

## 2023-08-26 DIAGNOSIS — Z79899 Other long term (current) drug therapy: Secondary | ICD-10-CM | POA: Diagnosis not present

## 2023-08-26 DIAGNOSIS — E559 Vitamin D deficiency, unspecified: Secondary | ICD-10-CM | POA: Diagnosis not present

## 2023-08-26 DIAGNOSIS — E782 Mixed hyperlipidemia: Secondary | ICD-10-CM | POA: Diagnosis not present

## 2023-08-26 DIAGNOSIS — K219 Gastro-esophageal reflux disease without esophagitis: Secondary | ICD-10-CM | POA: Diagnosis not present

## 2023-08-26 DIAGNOSIS — M545 Low back pain, unspecified: Secondary | ICD-10-CM | POA: Diagnosis not present

## 2023-09-23 DIAGNOSIS — M545 Low back pain, unspecified: Secondary | ICD-10-CM | POA: Diagnosis not present

## 2023-09-23 DIAGNOSIS — K219 Gastro-esophageal reflux disease without esophagitis: Secondary | ICD-10-CM | POA: Diagnosis not present

## 2023-09-23 DIAGNOSIS — G8929 Other chronic pain: Secondary | ICD-10-CM | POA: Diagnosis not present

## 2023-10-21 DIAGNOSIS — R739 Hyperglycemia, unspecified: Secondary | ICD-10-CM | POA: Diagnosis not present

## 2023-10-21 DIAGNOSIS — Z79899 Other long term (current) drug therapy: Secondary | ICD-10-CM | POA: Diagnosis not present

## 2023-10-21 DIAGNOSIS — M545 Low back pain, unspecified: Secondary | ICD-10-CM | POA: Diagnosis not present

## 2023-10-21 DIAGNOSIS — G8929 Other chronic pain: Secondary | ICD-10-CM | POA: Diagnosis not present

## 2023-11-23 DIAGNOSIS — E559 Vitamin D deficiency, unspecified: Secondary | ICD-10-CM | POA: Diagnosis not present

## 2023-11-23 DIAGNOSIS — M545 Low back pain, unspecified: Secondary | ICD-10-CM | POA: Diagnosis not present

## 2023-11-23 DIAGNOSIS — Z79899 Other long term (current) drug therapy: Secondary | ICD-10-CM | POA: Diagnosis not present

## 2023-11-23 DIAGNOSIS — G8929 Other chronic pain: Secondary | ICD-10-CM | POA: Diagnosis not present

## 2023-11-23 DIAGNOSIS — E782 Mixed hyperlipidemia: Secondary | ICD-10-CM | POA: Diagnosis not present

## 2023-11-23 DIAGNOSIS — K219 Gastro-esophageal reflux disease without esophagitis: Secondary | ICD-10-CM | POA: Diagnosis not present

## 2023-11-23 DIAGNOSIS — G629 Polyneuropathy, unspecified: Secondary | ICD-10-CM | POA: Diagnosis not present

## 2023-11-23 DIAGNOSIS — R739 Hyperglycemia, unspecified: Secondary | ICD-10-CM | POA: Diagnosis not present

## 2023-12-23 DIAGNOSIS — R739 Hyperglycemia, unspecified: Secondary | ICD-10-CM | POA: Diagnosis not present

## 2023-12-23 DIAGNOSIS — G629 Polyneuropathy, unspecified: Secondary | ICD-10-CM | POA: Diagnosis not present

## 2023-12-23 DIAGNOSIS — G8929 Other chronic pain: Secondary | ICD-10-CM | POA: Diagnosis not present

## 2023-12-23 DIAGNOSIS — M545 Low back pain, unspecified: Secondary | ICD-10-CM | POA: Diagnosis not present

## 2023-12-23 DIAGNOSIS — E559 Vitamin D deficiency, unspecified: Secondary | ICD-10-CM | POA: Diagnosis not present

## 2023-12-23 DIAGNOSIS — E782 Mixed hyperlipidemia: Secondary | ICD-10-CM | POA: Diagnosis not present

## 2023-12-23 DIAGNOSIS — Z79899 Other long term (current) drug therapy: Secondary | ICD-10-CM | POA: Diagnosis not present

## 2024-01-27 DIAGNOSIS — E559 Vitamin D deficiency, unspecified: Secondary | ICD-10-CM | POA: Diagnosis not present

## 2024-01-27 DIAGNOSIS — Z9109 Other allergy status, other than to drugs and biological substances: Secondary | ICD-10-CM | POA: Diagnosis not present

## 2024-01-27 DIAGNOSIS — R739 Hyperglycemia, unspecified: Secondary | ICD-10-CM | POA: Diagnosis not present

## 2024-01-27 DIAGNOSIS — G8929 Other chronic pain: Secondary | ICD-10-CM | POA: Diagnosis not present

## 2024-01-27 DIAGNOSIS — M545 Low back pain, unspecified: Secondary | ICD-10-CM | POA: Diagnosis not present

## 2024-01-27 DIAGNOSIS — E782 Mixed hyperlipidemia: Secondary | ICD-10-CM | POA: Diagnosis not present
